# Patient Record
Sex: Female | Born: 1940 | Race: White | Hispanic: No
Health system: Southern US, Community
[De-identification: ages and names within clinical notes are randomized; demographics above are authoritative.]

## PROBLEM LIST (undated history)

## (undated) DIAGNOSIS — N183 Chronic kidney disease, stage 3 unspecified: Secondary | ICD-10-CM

## (undated) DIAGNOSIS — Z87442 Personal history of urinary calculi: Secondary | ICD-10-CM

## (undated) DIAGNOSIS — Z720 Tobacco use: Secondary | ICD-10-CM

## (undated) DIAGNOSIS — Z952 Presence of prosthetic heart valve: Secondary | ICD-10-CM

## (undated) DIAGNOSIS — I5032 Chronic diastolic (congestive) heart failure: Secondary | ICD-10-CM

## (undated) DIAGNOSIS — I350 Nonrheumatic aortic (valve) stenosis: Secondary | ICD-10-CM

## (undated) DIAGNOSIS — R911 Solitary pulmonary nodule: Secondary | ICD-10-CM

## (undated) DIAGNOSIS — E785 Hyperlipidemia, unspecified: Secondary | ICD-10-CM

## (undated) DIAGNOSIS — I10 Essential (primary) hypertension: Secondary | ICD-10-CM

## (undated) HISTORY — PX: EYE SURGERY: SHX253

## (undated) HISTORY — PX: APPENDECTOMY: SHX54

## (undated) HISTORY — PX: CYST EXCISION: SHX5701

## (undated) HISTORY — DX: Chronic kidney disease, stage 3 unspecified: N18.30

## (undated) HISTORY — PX: TONSILLECTOMY: SUR1361

## (undated) HISTORY — DX: Solitary pulmonary nodule: R91.1

## (undated) HISTORY — DX: Hyperlipidemia, unspecified: E78.5

## (undated) HISTORY — DX: Essential (primary) hypertension: I10

## (undated) HISTORY — DX: Tobacco use: Z72.0

---
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
## 2015-05-28 DIAGNOSIS — I10 Essential (primary) hypertension: Secondary | ICD-10-CM | POA: Diagnosis not present

## 2015-05-28 DIAGNOSIS — E7800 Pure hypercholesterolemia, unspecified: Secondary | ICD-10-CM | POA: Diagnosis not present

## 2015-06-21 DIAGNOSIS — E7800 Pure hypercholesterolemia, unspecified: Secondary | ICD-10-CM | POA: Diagnosis not present

## 2015-06-21 DIAGNOSIS — I10 Essential (primary) hypertension: Secondary | ICD-10-CM | POA: Diagnosis not present

## 2015-12-18 DIAGNOSIS — Z299 Encounter for prophylactic measures, unspecified: Secondary | ICD-10-CM | POA: Diagnosis not present

## 2015-12-18 DIAGNOSIS — Z0000 Encounter for general adult medical examination without abnormal findings: Secondary | ICD-10-CM | POA: Diagnosis not present

## 2015-12-18 DIAGNOSIS — I10 Essential (primary) hypertension: Secondary | ICD-10-CM | POA: Diagnosis not present

## 2015-12-18 DIAGNOSIS — Z7189 Other specified counseling: Secondary | ICD-10-CM | POA: Diagnosis not present

## 2015-12-18 DIAGNOSIS — Z1389 Encounter for screening for other disorder: Secondary | ICD-10-CM | POA: Diagnosis not present

## 2015-12-18 DIAGNOSIS — Z79899 Other long term (current) drug therapy: Secondary | ICD-10-CM | POA: Diagnosis not present

## 2015-12-18 DIAGNOSIS — E7800 Pure hypercholesterolemia, unspecified: Secondary | ICD-10-CM | POA: Diagnosis not present

## 2015-12-18 DIAGNOSIS — R5383 Other fatigue: Secondary | ICD-10-CM | POA: Diagnosis not present

## 2015-12-18 DIAGNOSIS — Z1211 Encounter for screening for malignant neoplasm of colon: Secondary | ICD-10-CM | POA: Diagnosis not present

## 2015-12-24 DIAGNOSIS — R010 Benign and innocent cardiac murmurs: Secondary | ICD-10-CM | POA: Diagnosis not present

## 2016-01-21 DIAGNOSIS — Z1231 Encounter for screening mammogram for malignant neoplasm of breast: Secondary | ICD-10-CM | POA: Diagnosis not present

## 2016-02-14 DIAGNOSIS — E2839 Other primary ovarian failure: Secondary | ICD-10-CM | POA: Diagnosis not present

## 2016-03-27 DIAGNOSIS — Z713 Dietary counseling and surveillance: Secondary | ICD-10-CM | POA: Diagnosis not present

## 2016-03-27 DIAGNOSIS — Z6832 Body mass index (BMI) 32.0-32.9, adult: Secondary | ICD-10-CM | POA: Diagnosis not present

## 2016-03-27 DIAGNOSIS — Z299 Encounter for prophylactic measures, unspecified: Secondary | ICD-10-CM | POA: Diagnosis not present

## 2016-03-27 DIAGNOSIS — E7800 Pure hypercholesterolemia, unspecified: Secondary | ICD-10-CM | POA: Diagnosis not present

## 2016-03-27 DIAGNOSIS — Z720 Tobacco use: Secondary | ICD-10-CM | POA: Diagnosis not present

## 2016-03-27 DIAGNOSIS — I10 Essential (primary) hypertension: Secondary | ICD-10-CM | POA: Diagnosis not present

## 2016-03-27 DIAGNOSIS — F17210 Nicotine dependence, cigarettes, uncomplicated: Secondary | ICD-10-CM | POA: Diagnosis not present

## 2016-07-15 DIAGNOSIS — I10 Essential (primary) hypertension: Secondary | ICD-10-CM | POA: Diagnosis not present

## 2016-07-15 DIAGNOSIS — Z6832 Body mass index (BMI) 32.0-32.9, adult: Secondary | ICD-10-CM | POA: Diagnosis not present

## 2016-07-15 DIAGNOSIS — I272 Pulmonary hypertension, unspecified: Secondary | ICD-10-CM | POA: Diagnosis not present

## 2016-07-15 DIAGNOSIS — Z713 Dietary counseling and surveillance: Secondary | ICD-10-CM | POA: Diagnosis not present

## 2016-07-15 DIAGNOSIS — G629 Polyneuropathy, unspecified: Secondary | ICD-10-CM | POA: Diagnosis not present

## 2016-07-15 DIAGNOSIS — E7800 Pure hypercholesterolemia, unspecified: Secondary | ICD-10-CM | POA: Diagnosis not present

## 2016-07-15 DIAGNOSIS — Z299 Encounter for prophylactic measures, unspecified: Secondary | ICD-10-CM | POA: Diagnosis not present

## 2016-07-15 DIAGNOSIS — Z79899 Other long term (current) drug therapy: Secondary | ICD-10-CM | POA: Diagnosis not present

## 2016-10-02 DIAGNOSIS — Z6832 Body mass index (BMI) 32.0-32.9, adult: Secondary | ICD-10-CM | POA: Diagnosis not present

## 2016-10-02 DIAGNOSIS — Z299 Encounter for prophylactic measures, unspecified: Secondary | ICD-10-CM | POA: Diagnosis not present

## 2016-10-02 DIAGNOSIS — Z713 Dietary counseling and surveillance: Secondary | ICD-10-CM | POA: Diagnosis not present

## 2016-10-02 DIAGNOSIS — H9202 Otalgia, left ear: Secondary | ICD-10-CM | POA: Diagnosis not present

## 2016-10-09 DIAGNOSIS — H93291 Other abnormal auditory perceptions, right ear: Secondary | ICD-10-CM | POA: Diagnosis not present

## 2016-10-09 DIAGNOSIS — H90A22 Sensorineural hearing loss, unilateral, left ear, with restricted hearing on the contralateral side: Secondary | ICD-10-CM | POA: Diagnosis not present

## 2016-10-09 DIAGNOSIS — H9041 Sensorineural hearing loss, unilateral, right ear, with unrestricted hearing on the contralateral side: Secondary | ICD-10-CM | POA: Diagnosis not present

## 2016-10-09 DIAGNOSIS — H6123 Impacted cerumen, bilateral: Secondary | ICD-10-CM | POA: Diagnosis not present

## 2016-10-15 DIAGNOSIS — H9202 Otalgia, left ear: Secondary | ICD-10-CM | POA: Diagnosis not present

## 2016-10-15 DIAGNOSIS — H9201 Otalgia, right ear: Secondary | ICD-10-CM | POA: Diagnosis not present

## 2016-10-15 DIAGNOSIS — H9041 Sensorineural hearing loss, unilateral, right ear, with unrestricted hearing on the contralateral side: Secondary | ICD-10-CM | POA: Diagnosis not present

## 2016-10-23 DIAGNOSIS — H90A22 Sensorineural hearing loss, unilateral, left ear, with restricted hearing on the contralateral side: Secondary | ICD-10-CM | POA: Diagnosis not present

## 2016-10-23 DIAGNOSIS — H9041 Sensorineural hearing loss, unilateral, right ear, with unrestricted hearing on the contralateral side: Secondary | ICD-10-CM | POA: Diagnosis not present

## 2016-10-23 DIAGNOSIS — H93291 Other abnormal auditory perceptions, right ear: Secondary | ICD-10-CM | POA: Diagnosis not present

## 2016-10-31 DIAGNOSIS — E7800 Pure hypercholesterolemia, unspecified: Secondary | ICD-10-CM | POA: Diagnosis not present

## 2016-10-31 DIAGNOSIS — I10 Essential (primary) hypertension: Secondary | ICD-10-CM | POA: Diagnosis not present

## 2016-11-18 DIAGNOSIS — E7800 Pure hypercholesterolemia, unspecified: Secondary | ICD-10-CM | POA: Diagnosis not present

## 2016-11-18 DIAGNOSIS — I10 Essential (primary) hypertension: Secondary | ICD-10-CM | POA: Diagnosis not present

## 2016-12-23 DIAGNOSIS — R5383 Other fatigue: Secondary | ICD-10-CM | POA: Diagnosis not present

## 2016-12-23 DIAGNOSIS — Z7189 Other specified counseling: Secondary | ICD-10-CM | POA: Diagnosis not present

## 2016-12-23 DIAGNOSIS — Z1331 Encounter for screening for depression: Secondary | ICD-10-CM | POA: Diagnosis not present

## 2016-12-23 DIAGNOSIS — Z6833 Body mass index (BMI) 33.0-33.9, adult: Secondary | ICD-10-CM | POA: Diagnosis not present

## 2016-12-23 DIAGNOSIS — F17210 Nicotine dependence, cigarettes, uncomplicated: Secondary | ICD-10-CM | POA: Diagnosis not present

## 2016-12-23 DIAGNOSIS — Z0000 Encounter for general adult medical examination without abnormal findings: Secondary | ICD-10-CM | POA: Diagnosis not present

## 2016-12-23 DIAGNOSIS — Z1339 Encounter for screening examination for other mental health and behavioral disorders: Secondary | ICD-10-CM | POA: Diagnosis not present

## 2016-12-23 DIAGNOSIS — G629 Polyneuropathy, unspecified: Secondary | ICD-10-CM | POA: Diagnosis not present

## 2016-12-23 DIAGNOSIS — Z299 Encounter for prophylactic measures, unspecified: Secondary | ICD-10-CM | POA: Diagnosis not present

## 2016-12-23 DIAGNOSIS — E7800 Pure hypercholesterolemia, unspecified: Secondary | ICD-10-CM | POA: Diagnosis not present

## 2016-12-23 DIAGNOSIS — Z79899 Other long term (current) drug therapy: Secondary | ICD-10-CM | POA: Diagnosis not present

## 2016-12-23 DIAGNOSIS — I10 Essential (primary) hypertension: Secondary | ICD-10-CM | POA: Diagnosis not present

## 2016-12-23 DIAGNOSIS — I350 Nonrheumatic aortic (valve) stenosis: Secondary | ICD-10-CM | POA: Diagnosis not present

## 2016-12-24 DIAGNOSIS — E7800 Pure hypercholesterolemia, unspecified: Secondary | ICD-10-CM | POA: Diagnosis not present

## 2016-12-24 DIAGNOSIS — I10 Essential (primary) hypertension: Secondary | ICD-10-CM | POA: Diagnosis not present

## 2017-01-05 DIAGNOSIS — I350 Nonrheumatic aortic (valve) stenosis: Secondary | ICD-10-CM | POA: Diagnosis not present

## 2017-01-13 DIAGNOSIS — C44311 Basal cell carcinoma of skin of nose: Secondary | ICD-10-CM | POA: Diagnosis not present

## 2017-01-13 DIAGNOSIS — D492 Neoplasm of unspecified behavior of bone, soft tissue, and skin: Secondary | ICD-10-CM | POA: Diagnosis not present

## 2017-01-13 DIAGNOSIS — I10 Essential (primary) hypertension: Secondary | ICD-10-CM | POA: Diagnosis not present

## 2017-01-13 DIAGNOSIS — Z299 Encounter for prophylactic measures, unspecified: Secondary | ICD-10-CM | POA: Diagnosis not present

## 2017-01-13 DIAGNOSIS — Z6833 Body mass index (BMI) 33.0-33.9, adult: Secondary | ICD-10-CM | POA: Diagnosis not present

## 2017-01-22 DIAGNOSIS — I10 Essential (primary) hypertension: Secondary | ICD-10-CM | POA: Diagnosis not present

## 2017-01-22 DIAGNOSIS — E7800 Pure hypercholesterolemia, unspecified: Secondary | ICD-10-CM | POA: Diagnosis not present

## 2017-02-19 DIAGNOSIS — Z1231 Encounter for screening mammogram for malignant neoplasm of breast: Secondary | ICD-10-CM | POA: Diagnosis not present

## 2017-03-06 DIAGNOSIS — E7800 Pure hypercholesterolemia, unspecified: Secondary | ICD-10-CM | POA: Diagnosis not present

## 2017-03-06 DIAGNOSIS — I10 Essential (primary) hypertension: Secondary | ICD-10-CM | POA: Diagnosis not present

## 2017-04-01 DIAGNOSIS — C44311 Basal cell carcinoma of skin of nose: Secondary | ICD-10-CM | POA: Diagnosis not present

## 2017-04-10 DIAGNOSIS — I10 Essential (primary) hypertension: Secondary | ICD-10-CM | POA: Diagnosis not present

## 2017-04-10 DIAGNOSIS — E7800 Pure hypercholesterolemia, unspecified: Secondary | ICD-10-CM | POA: Diagnosis not present

## 2017-04-23 DIAGNOSIS — E7800 Pure hypercholesterolemia, unspecified: Secondary | ICD-10-CM | POA: Diagnosis not present

## 2017-04-23 DIAGNOSIS — I10 Essential (primary) hypertension: Secondary | ICD-10-CM | POA: Diagnosis not present

## 2017-06-17 DIAGNOSIS — M79671 Pain in right foot: Secondary | ICD-10-CM | POA: Diagnosis not present

## 2017-06-17 DIAGNOSIS — B351 Tinea unguium: Secondary | ICD-10-CM | POA: Diagnosis not present

## 2017-06-17 DIAGNOSIS — M79674 Pain in right toe(s): Secondary | ICD-10-CM | POA: Diagnosis not present

## 2017-06-17 DIAGNOSIS — M79672 Pain in left foot: Secondary | ICD-10-CM | POA: Diagnosis not present

## 2017-07-03 DIAGNOSIS — I10 Essential (primary) hypertension: Secondary | ICD-10-CM | POA: Diagnosis not present

## 2017-07-03 DIAGNOSIS — E7800 Pure hypercholesterolemia, unspecified: Secondary | ICD-10-CM | POA: Diagnosis not present

## 2017-08-21 DIAGNOSIS — I10 Essential (primary) hypertension: Secondary | ICD-10-CM | POA: Diagnosis not present

## 2017-08-21 DIAGNOSIS — E7800 Pure hypercholesterolemia, unspecified: Secondary | ICD-10-CM | POA: Diagnosis not present

## 2017-09-25 DIAGNOSIS — E7800 Pure hypercholesterolemia, unspecified: Secondary | ICD-10-CM | POA: Diagnosis not present

## 2017-09-25 DIAGNOSIS — I10 Essential (primary) hypertension: Secondary | ICD-10-CM | POA: Diagnosis not present

## 2017-10-20 DIAGNOSIS — L723 Sebaceous cyst: Secondary | ICD-10-CM | POA: Diagnosis not present

## 2017-10-20 DIAGNOSIS — L602 Onychogryphosis: Secondary | ICD-10-CM | POA: Diagnosis not present

## 2017-10-20 DIAGNOSIS — B351 Tinea unguium: Secondary | ICD-10-CM | POA: Diagnosis not present

## 2017-10-20 DIAGNOSIS — M2041 Other hammer toe(s) (acquired), right foot: Secondary | ICD-10-CM | POA: Diagnosis not present

## 2017-10-20 DIAGNOSIS — L600 Ingrowing nail: Secondary | ICD-10-CM | POA: Diagnosis not present

## 2017-10-20 DIAGNOSIS — M79674 Pain in right toe(s): Secondary | ICD-10-CM | POA: Diagnosis not present

## 2017-10-28 DIAGNOSIS — I10 Essential (primary) hypertension: Secondary | ICD-10-CM | POA: Diagnosis not present

## 2017-10-28 DIAGNOSIS — E7800 Pure hypercholesterolemia, unspecified: Secondary | ICD-10-CM | POA: Diagnosis not present

## 2017-11-16 DIAGNOSIS — H2512 Age-related nuclear cataract, left eye: Secondary | ICD-10-CM | POA: Diagnosis not present

## 2017-12-30 DIAGNOSIS — H2512 Age-related nuclear cataract, left eye: Secondary | ICD-10-CM | POA: Diagnosis not present

## 2018-01-06 DIAGNOSIS — Z6832 Body mass index (BMI) 32.0-32.9, adult: Secondary | ICD-10-CM | POA: Diagnosis not present

## 2018-01-06 DIAGNOSIS — Z1211 Encounter for screening for malignant neoplasm of colon: Secondary | ICD-10-CM | POA: Diagnosis not present

## 2018-01-06 DIAGNOSIS — Z0000 Encounter for general adult medical examination without abnormal findings: Secondary | ICD-10-CM | POA: Diagnosis not present

## 2018-01-06 DIAGNOSIS — Z7189 Other specified counseling: Secondary | ICD-10-CM | POA: Diagnosis not present

## 2018-01-06 DIAGNOSIS — Z79899 Other long term (current) drug therapy: Secondary | ICD-10-CM | POA: Diagnosis not present

## 2018-01-06 DIAGNOSIS — I350 Nonrheumatic aortic (valve) stenosis: Secondary | ICD-10-CM | POA: Diagnosis not present

## 2018-01-06 DIAGNOSIS — Z1331 Encounter for screening for depression: Secondary | ICD-10-CM | POA: Diagnosis not present

## 2018-01-06 DIAGNOSIS — R5383 Other fatigue: Secondary | ICD-10-CM | POA: Diagnosis not present

## 2018-01-06 DIAGNOSIS — I10 Essential (primary) hypertension: Secondary | ICD-10-CM | POA: Diagnosis not present

## 2018-01-06 DIAGNOSIS — Z299 Encounter for prophylactic measures, unspecified: Secondary | ICD-10-CM | POA: Diagnosis not present

## 2018-01-06 DIAGNOSIS — Z1339 Encounter for screening examination for other mental health and behavioral disorders: Secondary | ICD-10-CM | POA: Diagnosis not present

## 2018-01-06 DIAGNOSIS — E7800 Pure hypercholesterolemia, unspecified: Secondary | ICD-10-CM | POA: Diagnosis not present

## 2018-01-13 DIAGNOSIS — E7800 Pure hypercholesterolemia, unspecified: Secondary | ICD-10-CM | POA: Diagnosis not present

## 2018-01-13 DIAGNOSIS — I10 Essential (primary) hypertension: Secondary | ICD-10-CM | POA: Diagnosis not present

## 2018-01-19 DIAGNOSIS — M2041 Other hammer toe(s) (acquired), right foot: Secondary | ICD-10-CM | POA: Diagnosis not present

## 2018-01-19 DIAGNOSIS — M79675 Pain in left toe(s): Secondary | ICD-10-CM | POA: Diagnosis not present

## 2018-01-19 DIAGNOSIS — L600 Ingrowing nail: Secondary | ICD-10-CM | POA: Diagnosis not present

## 2018-01-19 DIAGNOSIS — M79674 Pain in right toe(s): Secondary | ICD-10-CM | POA: Diagnosis not present

## 2018-01-19 DIAGNOSIS — L723 Sebaceous cyst: Secondary | ICD-10-CM | POA: Diagnosis not present

## 2018-01-19 DIAGNOSIS — B351 Tinea unguium: Secondary | ICD-10-CM | POA: Diagnosis not present

## 2018-01-27 DIAGNOSIS — H2511 Age-related nuclear cataract, right eye: Secondary | ICD-10-CM | POA: Diagnosis not present

## 2018-02-12 DIAGNOSIS — I10 Essential (primary) hypertension: Secondary | ICD-10-CM | POA: Diagnosis not present

## 2018-02-12 DIAGNOSIS — E7800 Pure hypercholesterolemia, unspecified: Secondary | ICD-10-CM | POA: Diagnosis not present

## 2018-02-23 DIAGNOSIS — Z1231 Encounter for screening mammogram for malignant neoplasm of breast: Secondary | ICD-10-CM | POA: Diagnosis not present

## 2018-03-12 DIAGNOSIS — I10 Essential (primary) hypertension: Secondary | ICD-10-CM | POA: Diagnosis not present

## 2018-03-12 DIAGNOSIS — E7800 Pure hypercholesterolemia, unspecified: Secondary | ICD-10-CM | POA: Diagnosis not present

## 2018-04-20 DIAGNOSIS — L723 Sebaceous cyst: Secondary | ICD-10-CM | POA: Diagnosis not present

## 2018-04-20 DIAGNOSIS — L600 Ingrowing nail: Secondary | ICD-10-CM | POA: Diagnosis not present

## 2018-04-20 DIAGNOSIS — B351 Tinea unguium: Secondary | ICD-10-CM | POA: Diagnosis not present

## 2018-04-20 DIAGNOSIS — M79675 Pain in left toe(s): Secondary | ICD-10-CM | POA: Diagnosis not present

## 2018-04-20 DIAGNOSIS — L602 Onychogryphosis: Secondary | ICD-10-CM | POA: Diagnosis not present

## 2018-04-20 DIAGNOSIS — M79674 Pain in right toe(s): Secondary | ICD-10-CM | POA: Diagnosis not present

## 2018-06-11 DIAGNOSIS — E7800 Pure hypercholesterolemia, unspecified: Secondary | ICD-10-CM | POA: Diagnosis not present

## 2018-06-11 DIAGNOSIS — I10 Essential (primary) hypertension: Secondary | ICD-10-CM | POA: Diagnosis not present

## 2018-07-08 DIAGNOSIS — I10 Essential (primary) hypertension: Secondary | ICD-10-CM | POA: Diagnosis not present

## 2018-07-08 DIAGNOSIS — E7800 Pure hypercholesterolemia, unspecified: Secondary | ICD-10-CM | POA: Diagnosis not present

## 2018-07-20 DIAGNOSIS — L602 Onychogryphosis: Secondary | ICD-10-CM | POA: Diagnosis not present

## 2018-07-20 DIAGNOSIS — L600 Ingrowing nail: Secondary | ICD-10-CM | POA: Diagnosis not present

## 2018-07-20 DIAGNOSIS — L723 Sebaceous cyst: Secondary | ICD-10-CM | POA: Diagnosis not present

## 2018-07-20 DIAGNOSIS — M79675 Pain in left toe(s): Secondary | ICD-10-CM | POA: Diagnosis not present

## 2018-07-20 DIAGNOSIS — B351 Tinea unguium: Secondary | ICD-10-CM | POA: Diagnosis not present

## 2018-07-20 DIAGNOSIS — M79674 Pain in right toe(s): Secondary | ICD-10-CM | POA: Diagnosis not present

## 2018-07-23 DIAGNOSIS — E7800 Pure hypercholesterolemia, unspecified: Secondary | ICD-10-CM | POA: Diagnosis not present

## 2018-07-23 DIAGNOSIS — I10 Essential (primary) hypertension: Secondary | ICD-10-CM | POA: Diagnosis not present

## 2018-07-28 DIAGNOSIS — Z299 Encounter for prophylactic measures, unspecified: Secondary | ICD-10-CM | POA: Diagnosis not present

## 2018-07-28 DIAGNOSIS — Z713 Dietary counseling and surveillance: Secondary | ICD-10-CM | POA: Diagnosis not present

## 2018-07-28 DIAGNOSIS — Z6833 Body mass index (BMI) 33.0-33.9, adult: Secondary | ICD-10-CM | POA: Diagnosis not present

## 2018-07-28 DIAGNOSIS — I10 Essential (primary) hypertension: Secondary | ICD-10-CM | POA: Diagnosis not present

## 2018-08-23 DIAGNOSIS — I10 Essential (primary) hypertension: Secondary | ICD-10-CM | POA: Diagnosis not present

## 2018-08-23 DIAGNOSIS — E7800 Pure hypercholesterolemia, unspecified: Secondary | ICD-10-CM | POA: Diagnosis not present

## 2018-09-07 DIAGNOSIS — J069 Acute upper respiratory infection, unspecified: Secondary | ICD-10-CM | POA: Diagnosis not present

## 2018-09-07 DIAGNOSIS — F17210 Nicotine dependence, cigarettes, uncomplicated: Secondary | ICD-10-CM | POA: Diagnosis not present

## 2018-09-07 DIAGNOSIS — I10 Essential (primary) hypertension: Secondary | ICD-10-CM | POA: Diagnosis not present

## 2018-09-07 DIAGNOSIS — Z299 Encounter for prophylactic measures, unspecified: Secondary | ICD-10-CM | POA: Diagnosis not present

## 2018-10-05 DIAGNOSIS — Z79899 Other long term (current) drug therapy: Secondary | ICD-10-CM | POA: Diagnosis not present

## 2018-10-05 DIAGNOSIS — Z1159 Encounter for screening for other viral diseases: Secondary | ICD-10-CM | POA: Diagnosis not present

## 2018-10-05 DIAGNOSIS — R0689 Other abnormalities of breathing: Secondary | ICD-10-CM | POA: Diagnosis not present

## 2018-10-05 DIAGNOSIS — J9601 Acute respiratory failure with hypoxia: Secondary | ICD-10-CM | POA: Diagnosis not present

## 2018-10-05 DIAGNOSIS — I5033 Acute on chronic diastolic (congestive) heart failure: Secondary | ICD-10-CM | POA: Diagnosis present

## 2018-10-05 DIAGNOSIS — E785 Hyperlipidemia, unspecified: Secondary | ICD-10-CM | POA: Diagnosis not present

## 2018-10-05 DIAGNOSIS — I110 Hypertensive heart disease with heart failure: Secondary | ICD-10-CM | POA: Diagnosis not present

## 2018-10-05 DIAGNOSIS — I509 Heart failure, unspecified: Secondary | ICD-10-CM | POA: Diagnosis not present

## 2018-10-05 DIAGNOSIS — I4891 Unspecified atrial fibrillation: Secondary | ICD-10-CM | POA: Diagnosis not present

## 2018-10-11 DIAGNOSIS — E7800 Pure hypercholesterolemia, unspecified: Secondary | ICD-10-CM | POA: Diagnosis not present

## 2018-10-11 DIAGNOSIS — Z299 Encounter for prophylactic measures, unspecified: Secondary | ICD-10-CM | POA: Diagnosis not present

## 2018-10-11 DIAGNOSIS — I272 Pulmonary hypertension, unspecified: Secondary | ICD-10-CM | POA: Diagnosis not present

## 2018-10-11 DIAGNOSIS — I10 Essential (primary) hypertension: Secondary | ICD-10-CM | POA: Diagnosis not present

## 2018-10-11 DIAGNOSIS — I5033 Acute on chronic diastolic (congestive) heart failure: Secondary | ICD-10-CM | POA: Diagnosis not present

## 2018-10-11 DIAGNOSIS — Z6833 Body mass index (BMI) 33.0-33.9, adult: Secondary | ICD-10-CM | POA: Diagnosis not present

## 2018-10-14 DIAGNOSIS — I10 Essential (primary) hypertension: Secondary | ICD-10-CM | POA: Diagnosis not present

## 2018-10-14 DIAGNOSIS — E7800 Pure hypercholesterolemia, unspecified: Secondary | ICD-10-CM | POA: Diagnosis not present

## 2018-10-19 DIAGNOSIS — M79674 Pain in right toe(s): Secondary | ICD-10-CM | POA: Diagnosis not present

## 2018-10-19 DIAGNOSIS — L03031 Cellulitis of right toe: Secondary | ICD-10-CM | POA: Diagnosis not present

## 2018-10-19 DIAGNOSIS — L600 Ingrowing nail: Secondary | ICD-10-CM | POA: Diagnosis not present

## 2018-10-19 DIAGNOSIS — M79675 Pain in left toe(s): Secondary | ICD-10-CM | POA: Diagnosis not present

## 2018-10-19 DIAGNOSIS — L602 Onychogryphosis: Secondary | ICD-10-CM | POA: Diagnosis not present

## 2018-10-19 DIAGNOSIS — L723 Sebaceous cyst: Secondary | ICD-10-CM | POA: Diagnosis not present

## 2018-10-19 DIAGNOSIS — B351 Tinea unguium: Secondary | ICD-10-CM | POA: Diagnosis not present

## 2018-11-10 DIAGNOSIS — Z299 Encounter for prophylactic measures, unspecified: Secondary | ICD-10-CM | POA: Diagnosis not present

## 2018-11-10 DIAGNOSIS — I5033 Acute on chronic diastolic (congestive) heart failure: Secondary | ICD-10-CM | POA: Diagnosis not present

## 2018-11-10 DIAGNOSIS — Z6833 Body mass index (BMI) 33.0-33.9, adult: Secondary | ICD-10-CM | POA: Diagnosis not present

## 2018-11-10 DIAGNOSIS — I350 Nonrheumatic aortic (valve) stenosis: Secondary | ICD-10-CM | POA: Diagnosis not present

## 2018-11-10 DIAGNOSIS — I10 Essential (primary) hypertension: Secondary | ICD-10-CM | POA: Diagnosis not present

## 2018-11-12 ENCOUNTER — Other Ambulatory Visit (HOSPITAL_COMMUNITY): Payer: Self-pay | Admitting: Cardiology

## 2018-11-12 ENCOUNTER — Ambulatory Visit (INDEPENDENT_AMBULATORY_CARE_PROVIDER_SITE_OTHER): Payer: Medicare Other | Admitting: Cardiology

## 2018-11-12 ENCOUNTER — Other Ambulatory Visit: Payer: Self-pay

## 2018-11-12 ENCOUNTER — Encounter: Payer: Self-pay | Admitting: Cardiology

## 2018-11-12 VITALS — BP 161/64 | HR 55 | Temp 96.9°F | Ht 60.0 in | Wt 170.0 lb

## 2018-11-12 DIAGNOSIS — I350 Nonrheumatic aortic (valve) stenosis: Secondary | ICD-10-CM | POA: Insufficient documentation

## 2018-11-12 HISTORY — DX: Nonrheumatic aortic (valve) stenosis: I35.0

## 2018-11-12 MED ORDER — FUROSEMIDE 20 MG PO TABS: 20.0000 mg | ORAL_TABLET | Freq: Every day | ORAL | 3 refills | Status: DC

## 2018-11-12 NOTE — Progress Notes (Signed)
Patient referred by Glenda Chroman, MD for severer aortic stenosis, moderate AR.  Subjective:   Emily Trujillo, female    DOB: 1940/02/26, 78 y.o.   MRN: [id number]   Chief Complaint  Patient presents with  . Aortic Stenosis  . Pre-op Exam  . New Patient (Initial Visit)     HPI  78 y.o. Caucasian female with hypertension, hyperlipidemia, h/o tobacco abuse, critical aortic stenosis.  Patient lives in [place], [place]. She was admitted to [facility] in [place] in 09/2018 with progressive dyspnea. She was treated for possible pneumonia with IV antibiotics. She was also diuresed with IV lasix with improvement in symptoms. BNP was elevated at 798. Procalcitonin was normal, making bacterial pnuemonia less likely. Echocardiogram showed mod LVH, EF 55-605, critical AS, mean PG 66 mmHg. Peak vel 5.1 m/sec. AVA 0.49 cm2. Mild AI. Mild MV calcification. Mild to mod MR. Mild TR. PASP 28 mmHg.   She has felt better since hospital discharge with her breathing. She has quit smoking. However, she has gained 5 lbs in last few days. She currently lives with her son. She has to take 10 steps to get to bathroom in her house. She is able to do this without significant dyspnea. She denies any chest pain, presyncope or syncope.    Past Medical History:  Diagnosis Date  . Acute hypoxemic respiratory failure ([place])   . CHF exacerbation ([place])   . Diastolic CHF ([place])   . Dyslipidemia   . Hypertension     History reviewed. No pertinent surgical history.   Social History   Socioeconomic History  . Marital status: Not on file    Spouse name: Not on file  . Number of children: Not on file  . Years of education: Not on file  . Highest education level: Not on file  Occupational History  . Not on file  Social Needs  . Financial resource strain: Not on file  . Food insecurity    Worry: Not on file    Inability: Not on file  . Transportation needs    Medical: Not on file    Non-medical: Not  on file  Tobacco Use  . Smoking status: Not on file  Substance and Sexual Activity  . Alcohol use: Not on file  . Drug use: Not on file  . Sexual activity: Not on file  Lifestyle  . Physical activity    Days per week: Not on file    Minutes per session: Not on file  . Stress: Not on file  Relationships  . Social Herbalist on phone: Not on file    Gets together: Not on file    Attends religious service: Not on file    Active member of club or organization: Not on file    Attends meetings of clubs or organizations: Not on file    Relationship status: Not on file  . Intimate partner violence    Fear of current or ex partner: Not on file    Emotionally abused: Not on file    Physically abused: Not on file    Forced sexual activity: Not on file  Other Topics Concern  . Not on file  Social History Narrative  . Not on file     Family History  Problem Relation Age of Onset  . Heart attack Father   . Cancer Sister      Current Outpatient Medications on File Prior to Visit  Medication Sig  Dispense Refill  . calcium-vitamin D (OSCAL WITH D) 500-200 MG-UNIT tablet Take 1 tablet by mouth.    . hydrocortisone 2.5 % cream Apply 1 application topically 2 (two) times daily.    Marland Kitchen lisinopril (ZESTRIL) 40 MG tablet Take 40 mg by mouth daily.    . rosuvastatin (CRESTOR) 20 MG tablet Take 20 mg by mouth daily.     No current facility-administered medications on file prior to visit.     Cardiovascular studies:  EKG 11/12/2018: Sinus rhythm 54 bpm. Occasional PAC.    Left axis -anterior fascicular block.  Left ventricular hypertrophy (strain).  Nonspecific ST depression, likely due to LVH.  Echocardiogram 10/06/2018: Normal LV size. Mod LVH. EF 55-60%.  Critical AS. Mean PG 66 mmHg. Peak vel 5.1 m/sec. AVA 0.49 cm2. Mild AI. Mild MV calcification. Mild to mod MR.  Mild TR. PASP 28 mmHg.  >50% IVC collapse.  Recent labs: 09/2018: Glucose 121, BUN/Cr 20/1.05. eGFR 51.  Na/K 140/3.8. Rest of the CMP normal. H/H 11.4/36/4. MCV 96. Platelets 202.  Trop 0.05-0.07. BNP 798.   Review of Systems  Constitution: Negative for decreased appetite, malaise/fatigue, weight gain and weight loss.  HENT: Negative for congestion.   Eyes: Negative for visual disturbance.  Cardiovascular: Positive for dyspnea on exertion. Negative for chest pain, leg swelling, palpitations and syncope.  Respiratory: Negative for cough.   Endocrine: Negative for cold intolerance.  Hematologic/Lymphatic: Does not bruise/bleed easily.  Skin: Negative for itching and rash.  Musculoskeletal: Negative for myalgias.  Gastrointestinal: Negative for abdominal pain, nausea and vomiting.  Genitourinary: Negative for dysuria.  Neurological: Negative for dizziness and weakness.  Psychiatric/Behavioral: The patient is not nervous/anxious.   All other systems reviewed and are negative.        Vitals:   11/12/18 1111  BP: (!) 161/64  Pulse: (!) 55  Temp: (!) 96.9 F (36.1 C)  SpO2: 97%     Body mass index is 33.2 kg/m. Filed Weights   11/12/18 1111  Weight: 170 lb (77.1 kg)     Objective:   Physical Exam  Constitutional: She is oriented to person, place, and time. She appears well-developed and well-nourished. No distress.  HENT:  Head: Normocephalic and atraumatic.  Eyes: Pupils are equal, round, and reactive to light. Conjunctivae are normal.  Neck: No JVD present.  Cardiovascular: Normal rate, regular rhythm and intact distal pulses.  Murmur heard.  Harsh crescendo-decrescendo midsystolic murmur is present with a grade of 4/6 at the upper right sternal border radiating to the neck. Pulmonary/Chest: Effort normal and breath sounds normal. She has no wheezes. She has no rales.  Abdominal: Soft. Bowel sounds are normal. There is no rebound.  Musculoskeletal:        General: Edema (Trace b/l) present.  Lymphadenopathy:    She has no cervical adenopathy.  Neurological: She is  alert and oriented to person, place, and time. No cranial nerve deficit.  Skin: Skin is warm and dry.  Psychiatric: She has a normal mood and affect.  Nursing note and vitals reviewed.         Assessment & Recommendations:   78 y.o. Caucasian female with hypertension, hyperlipidemia, stage D critical aortic stenosis.  Stage D critical aortic stenosis: AVA 0.49 cm2, mean PG 66 mmHg, Vmax 5.1 m/sec. Will try to obtain echo images from [facility]. If images are adequate, she may not need repeat TTE/TEE. Needs urgent evaluation for possible TAVR.  I will perform right heart cath and coronary angiography and  refer to structural valve clinic, to be seen after cath.  Started lasix 20 mg daily.   Hypertension: Tolerating lisinopril 40 mg. Will continue  Discussed clinical course, natural history, and treatment options at length with the patient and her son, Gerald Stabs.    Thank you for referring the patient to [place]. Please feel free to contact with any questions.  Nigel Mormon, MD [facility] Pager: [phone] Office: [phone] If no answer Cell [phone]

## 2018-11-13 LAB — BASIC METABOLIC PANEL
BUN/Creatinine Ratio: 20 (ref 12–28)
BUN: 26 mg/dL (ref 8–27)
CO2: 26 mmol/L (ref 20–29)
Calcium: 9.6 mg/dL (ref 8.7–10.3)
Chloride: 107 mmol/L — ABNORMAL HIGH (ref 96–106)
Creatinine, Ser: 1.3 mg/dL — ABNORMAL HIGH (ref 0.57–1.00)
GFR calc Af Amer: 45 mL/min/{1.73_m2} — ABNORMAL LOW (ref >59)
GFR calc non Af Amer: 39 mL/min/{1.73_m2} — ABNORMAL LOW (ref >59)
Glucose: 213 mg/dL — ABNORMAL HIGH (ref 65–99)
Potassium: 5.5 mmol/L — ABNORMAL HIGH (ref 3.5–5.2)
Sodium: 146 mmol/L — ABNORMAL HIGH (ref 134–144)

## 2018-11-13 LAB — CBC
Hematocrit: 37.9 % (ref 34.0–46.6)
Hemoglobin: 12.2 g/dL (ref 11.1–15.9)
MCH: 29.8 pg (ref 26.6–33.0)
MCHC: 32.2 g/dL (ref 31.5–35.7)
MCV: 93 fL (ref 79–97)
Platelets: 208 10*3/uL (ref 150–450)
RBC: 4.09 x10E6/uL (ref 3.77–5.28)
RDW: 13.2 % (ref 11.7–15.4)
WBC: 7.6 10*3/uL (ref 3.4–10.8)

## 2018-11-15 DIAGNOSIS — E7800 Pure hypercholesterolemia, unspecified: Secondary | ICD-10-CM | POA: Diagnosis not present

## 2018-11-15 DIAGNOSIS — I10 Essential (primary) hypertension: Secondary | ICD-10-CM | POA: Diagnosis not present

## 2018-11-19 ENCOUNTER — Other Ambulatory Visit (HOSPITAL_COMMUNITY): Payer: Self-pay

## 2018-11-19 ENCOUNTER — Other Ambulatory Visit: Payer: Self-pay | Admitting: Cardiology

## 2018-11-19 ENCOUNTER — Other Ambulatory Visit (HOSPITAL_COMMUNITY)
Admission: RE | Admit: 2018-11-19 | Discharge: 2018-11-19 | Disposition: A | Discharge status: Home / Self Care | Payer: Medicare Other | Source: Ambulatory Visit | Attending: Cardiology | Admitting: Cardiology

## 2018-11-19 ENCOUNTER — Other Ambulatory Visit: Payer: Self-pay

## 2018-11-19 DIAGNOSIS — I350 Nonrheumatic aortic (valve) stenosis: Secondary | ICD-10-CM

## 2018-11-19 DIAGNOSIS — Z01812 Encounter for preprocedural laboratory examination: Secondary | ICD-10-CM | POA: Diagnosis not present

## 2018-11-19 DIAGNOSIS — Z20828 Contact with and (suspected) exposure to other viral communicable diseases: Secondary | ICD-10-CM | POA: Diagnosis not present

## 2018-11-23 ENCOUNTER — Other Ambulatory Visit: Payer: Self-pay

## 2018-11-23 ENCOUNTER — Ambulatory Visit (HOSPITAL_COMMUNITY)
Admission: RE | Admit: 2018-11-23 | Discharge: 2018-11-23 | Disposition: A | Discharge status: Home / Self Care | Payer: Medicare Other | Attending: Cardiology | Admitting: Cardiology

## 2018-11-23 ENCOUNTER — Encounter (HOSPITAL_COMMUNITY)
Admission: RE | Disposition: A | Discharge status: Home / Self Care | Payer: Self-pay | Source: Home / Self Care | Attending: Cardiology

## 2018-11-23 ENCOUNTER — Ambulatory Visit (HOSPITAL_COMMUNITY)
Admission: RE | Admit: 2018-11-23 | Discharge: 2018-11-23 | Disposition: A | Discharge status: Home / Self Care | Payer: Medicare Other | Source: Ambulatory Visit | Attending: Cardiology | Admitting: Cardiology

## 2018-11-23 DIAGNOSIS — I350 Nonrheumatic aortic (valve) stenosis: Secondary | ICD-10-CM | POA: Diagnosis not present

## 2018-11-23 DIAGNOSIS — Z20828 Contact with and (suspected) exposure to other viral communicable diseases: Secondary | ICD-10-CM | POA: Insufficient documentation

## 2018-11-23 DIAGNOSIS — Z79899 Other long term (current) drug therapy: Secondary | ICD-10-CM | POA: Diagnosis not present

## 2018-11-23 DIAGNOSIS — Z8249 Family history of ischemic heart disease and other diseases of the circulatory system: Secondary | ICD-10-CM | POA: Insufficient documentation

## 2018-11-23 DIAGNOSIS — E785 Hyperlipidemia, unspecified: Secondary | ICD-10-CM | POA: Insufficient documentation

## 2018-11-23 DIAGNOSIS — I110 Hypertensive heart disease with heart failure: Secondary | ICD-10-CM | POA: Diagnosis not present

## 2018-11-23 DIAGNOSIS — Z87891 Personal history of nicotine dependence: Secondary | ICD-10-CM | POA: Diagnosis not present

## 2018-11-23 DIAGNOSIS — I5032 Chronic diastolic (congestive) heart failure: Secondary | ICD-10-CM | POA: Diagnosis not present

## 2018-11-23 HISTORY — PX: RIGHT HEART CATH AND CORONARY ANGIOGRAPHY: CATH118264

## 2018-11-23 LAB — POCT I-STAT EG7
Acid-Base Excess: 3 mmol/L — ABNORMAL HIGH (ref 0.0–2.0)
Bicarbonate: 28.1 mmol/L — ABNORMAL HIGH (ref 20.0–28.0)
Calcium, Ion: 1.21 mmol/L (ref 1.15–1.40)
HCT: 34 % — ABNORMAL LOW (ref 36.0–46.0)
Hemoglobin: 11.6 g/dL — ABNORMAL LOW (ref 12.0–15.0)
O2 Saturation: 63 %
Potassium: 4 mmol/L (ref 3.5–5.1)
Sodium: 143 mmol/L (ref 135–145)
TCO2: 29 mmol/L (ref 22–32)
pCO2, Ven: 45.8 mmHg (ref 44.0–60.0)
pH, Ven: 7.395 (ref 7.250–7.430)
pO2, Ven: 33 mmHg (ref 32.0–45.0)

## 2018-11-23 LAB — BASIC METABOLIC PANEL
Anion gap: 13 (ref 5–15)
BUN: 24 mg/dL — ABNORMAL HIGH (ref 8–23)
CO2: 24 mmol/L (ref 22–32)
Calcium: 9.7 mg/dL (ref 8.9–10.3)
Chloride: 105 mmol/L (ref 98–111)
Creatinine, Ser: 1.41 mg/dL — ABNORMAL HIGH (ref 0.44–1.00)
GFR calc Af Amer: 41 mL/min — ABNORMAL LOW (ref >60)
GFR calc non Af Amer: 35 mL/min — ABNORMAL LOW (ref >60)
Glucose, Bld: 109 mg/dL — ABNORMAL HIGH (ref 70–99)
Potassium: 4.4 mmol/L (ref 3.5–5.1)
Sodium: 142 mmol/L (ref 135–145)

## 2018-11-23 LAB — POCT I-STAT 7, (LYTES, BLD GAS, ICA,H+H)
Acid-Base Excess: 1 mmol/L (ref 0.0–2.0)
Bicarbonate: 26.3 mmol/L (ref 20.0–28.0)
Calcium, Ion: 1.17 mmol/L (ref 1.15–1.40)
HCT: 34 % — ABNORMAL LOW (ref 36.0–46.0)
Hemoglobin: 11.6 g/dL — ABNORMAL LOW (ref 12.0–15.0)
O2 Saturation: 98 %
Potassium: 3.9 mmol/L (ref 3.5–5.1)
Sodium: 143 mmol/L (ref 135–145)
TCO2: 28 mmol/L (ref 22–32)
pCO2 arterial: 42 mmHg (ref 32.0–48.0)
pH, Arterial: 7.404 (ref 7.350–7.450)
pO2, Arterial: 111 mmHg — ABNORMAL HIGH (ref 83.0–108.0)

## 2018-11-23 LAB — SARS CORONAVIRUS 2 BY RT PCR (HOSPITAL ORDER, PERFORMED IN ~~LOC~~ HOSPITAL LAB): SARS Coronavirus 2: NEGATIVE

## 2018-11-23 SURGERY — RIGHT HEART CATH AND CORONARY ANGIOGRAPHY
Anesthesia: LOCAL

## 2018-11-23 MED ORDER — ONDANSETRON HCL 4 MG/2ML IJ SOLN: 4.0000 mg | Freq: Four times a day (QID) | INTRAMUSCULAR | Status: DC | PRN

## 2018-11-23 MED ORDER — HEPARIN SODIUM (PORCINE) 1000 UNIT/ML IJ SOLN
INTRAMUSCULAR | Status: DC | PRN
  Administered 2018-11-23: 4000 [IU] via INTRAVENOUS

## 2018-11-23 MED ORDER — NITROGLYCERIN 1 MG/10 ML FOR IR/CATH LAB
INTRA_ARTERIAL | Status: AC
  Filled 2018-11-23: qty 10

## 2018-11-23 MED ORDER — SODIUM CHLORIDE 0.9 % WEIGHT BASED INFUSION: 1.0000 mL/kg/h | INTRAVENOUS | Status: DC

## 2018-11-23 MED ORDER — ASPIRIN 81 MG PO CHEW
CHEWABLE_TABLET | ORAL | Status: AC
  Filled 2018-11-23: qty 1

## 2018-11-23 MED ORDER — HEPARIN (PORCINE) IN NACL 1000-0.9 UT/500ML-% IV SOLN
INTRAVENOUS | Status: DC | PRN
  Administered 2018-11-23 (×2): 500 mL

## 2018-11-23 MED ORDER — ASPIRIN 81 MG PO CHEW: 81.0000 mg | CHEWABLE_TABLET | ORAL | Status: DC

## 2018-11-23 MED ORDER — ASPIRIN 81 MG PO CHEW
CHEWABLE_TABLET | ORAL | Status: AC
  Administered 2018-11-23: 12:00:00 81 mg
  Filled 2018-11-23: qty 1

## 2018-11-23 MED ORDER — LIDOCAINE HCL (PF) 1 % IJ SOLN
INTRAMUSCULAR | Status: DC | PRN
  Administered 2018-11-23 (×2): 2 mL

## 2018-11-23 MED ORDER — VERAPAMIL HCL 2.5 MG/ML IV SOLN
INTRAVENOUS | Status: DC | PRN
  Administered 2018-11-23: 10 mL via INTRA_ARTERIAL

## 2018-11-23 MED ORDER — NITROGLYCERIN 1 MG/10 ML FOR IR/CATH LAB
INTRA_ARTERIAL | Status: DC | PRN
  Administered 2018-11-23: 100 ug via INTRA_ARTERIAL

## 2018-11-23 MED ORDER — SODIUM CHLORIDE 0.9 % IV SOLN: 250.0000 mL | INTRAVENOUS | Status: DC | PRN

## 2018-11-23 MED ORDER — IOHEXOL 350 MG/ML SOLN
INTRAVENOUS | Status: DC | PRN
  Administered 2018-11-23: 80 mL

## 2018-11-23 MED ORDER — FENTANYL CITRATE (PF) 100 MCG/2ML IJ SOLN
INTRAMUSCULAR | Status: AC
  Filled 2018-11-23: qty 2

## 2018-11-23 MED ORDER — HEPARIN SODIUM (PORCINE) 1000 UNIT/ML IJ SOLN
INTRAMUSCULAR | Status: AC
  Filled 2018-11-23: qty 1

## 2018-11-23 MED ORDER — FENTANYL CITRATE (PF) 100 MCG/2ML IJ SOLN
INTRAMUSCULAR | Status: DC | PRN
  Administered 2018-11-23 (×2): 12.5 ug via INTRAVENOUS

## 2018-11-23 MED ORDER — MIDAZOLAM HCL 2 MG/2ML IJ SOLN
INTRAMUSCULAR | Status: AC
  Filled 2018-11-23: qty 2

## 2018-11-23 MED ORDER — SODIUM CHLORIDE 0.9% FLUSH: 3.0000 mL | INTRAVENOUS | Status: DC | PRN

## 2018-11-23 MED ORDER — ACETAMINOPHEN 325 MG PO TABS: 650.0000 mg | ORAL_TABLET | ORAL | Status: DC | PRN

## 2018-11-23 MED ORDER — SODIUM CHLORIDE 0.9 % IV SOLN: INTRAVENOUS | Status: AC

## 2018-11-23 MED ORDER — LIDOCAINE HCL (PF) 1 % IJ SOLN
INTRAMUSCULAR | Status: AC
  Filled 2018-11-23: qty 30

## 2018-11-23 MED ORDER — HYDRALAZINE HCL 20 MG/ML IJ SOLN: 10.0000 mg | INTRAMUSCULAR | Status: DC | PRN

## 2018-11-23 MED ORDER — HEPARIN (PORCINE) IN NACL 1000-0.9 UT/500ML-% IV SOLN
INTRAVENOUS | Status: AC
  Filled 2018-11-23: qty 1000

## 2018-11-23 MED ORDER — MIDAZOLAM HCL 2 MG/2ML IJ SOLN
INTRAMUSCULAR | Status: DC | PRN
  Administered 2018-11-23 (×2): 0.5 mg via INTRAVENOUS

## 2018-11-23 MED ORDER — SODIUM CHLORIDE 0.9 % WEIGHT BASED INFUSION
3.0000 mL/kg/h | INTRAVENOUS | Status: AC
  Administered 2018-11-23: 12:00:00 3 mL/kg/h via INTRAVENOUS

## 2018-11-23 MED ORDER — SODIUM CHLORIDE 0.9% FLUSH: 3.0000 mL | Freq: Two times a day (BID) | INTRAVENOUS | Status: DC

## 2018-11-23 MED ORDER — LABETALOL HCL 5 MG/ML IV SOLN: 10.0000 mg | INTRAVENOUS | Status: DC | PRN

## 2018-11-23 MED ORDER — VERAPAMIL HCL 2.5 MG/ML IV SOLN
INTRAVENOUS | Status: AC
  Filled 2018-11-23: qty 2

## 2018-11-23 SURGICAL SUPPLY — 21 items
CATH 5FR JL3.5 JR4 ANG PIG MP (CATHETERS) ×1 IMPLANT
CATH BALLN WEDGE 5F 110CM (CATHETERS) ×1 IMPLANT
CATH INFINITI 5 FR 3DRC (CATHETERS) ×1 IMPLANT
CATH INFINITI 5 FR AR1 MOD (CATHETERS) ×1 IMPLANT
CATH INFINITI 5FR AL1 (CATHETERS) ×1 IMPLANT
CATH LAUNCHER 5F 3DRIGHT (CATHETERS) IMPLANT
CATH LAUNCHER 5F RADR (CATHETERS) IMPLANT
CATHETER LAUNCHER 5F 3DRIGHT (CATHETERS) ×2
CATHETER LAUNCHER 5F RADR (CATHETERS) ×2
DEVICE RAD COMP TR BAND LRG (VASCULAR PRODUCTS) ×1 IMPLANT
GLIDESHEATH SLEND SS 6F .021 (SHEATH) ×1 IMPLANT
GUIDEWIRE .025 260CM (WIRE) ×1 IMPLANT
GUIDEWIRE INQWIRE 1.5J.035X260 (WIRE) IMPLANT
INQWIRE 1.5J .035X260CM (WIRE) ×2
KIT HEART LEFT (KITS) ×2 IMPLANT
PACK CARDIAC CATHETERIZATION (CUSTOM PROCEDURE TRAY) ×2 IMPLANT
SHEATH GLIDE SLENDER 4/5FR (SHEATH) ×1 IMPLANT
SHEATH PROBE COVER 6X72 (BAG) ×1 IMPLANT
TRANSDUCER W/STOPCOCK (MISCELLANEOUS) ×2 IMPLANT
TUBING CIL FLEX 10 FLL-RA (TUBING) ×2 IMPLANT
WIRE HI TORQ VERSACORE-J 145CM (WIRE) ×1 IMPLANT

## 2018-11-23 NOTE — Progress Notes (Signed)
  Echocardiogram 2D Echocardiogram has been performed.  Bobbye Charleston 11/23/2018, 11:15 AM

## 2018-11-23 NOTE — Discharge Instructions (Signed)
Radial Site Care ° °This sheet gives you information about how to care for yourself after your procedure. Your health care provider may also give you more specific instructions. If you have problems or questions, contact your health care provider. °What can I expect after the procedure? °After the procedure, it is common to have: °· Bruising and tenderness at the catheter insertion area. °Follow these instructions at home: °Medicines °· Take over-the-counter and prescription medicines only as told by your health care provider. °Insertion site care °· Follow instructions from your health care provider about how to take care of your insertion site. Make sure you: °? Wash your hands with soap and water before you change your bandage (dressing). If soap and water are not available, use hand sanitizer. °? Change your dressing as told by your health care provider. °? Leave stitches (sutures), skin glue, or adhesive strips in place. These skin closures may need to stay in place for 2 weeks or longer. If adhesive strip edges start to loosen and curl up, you may trim the loose edges. Do not remove adhesive strips completely unless your health care provider tells you to do that. °· Check your insertion site every day for signs of infection. Check for: °? Redness, swelling, or pain. °? Fluid or blood. °? Pus or a bad smell. °? Warmth. °· Do not take baths, swim, or use a hot tub until your health care provider approves. °· You may shower 24-48 hours after the procedure, or as directed by your health care provider. °? Remove the dressing and gently wash the site with plain soap and water. °? Pat the area dry with a clean towel. °? Do not rub the site. That could cause bleeding. °· Do not apply powder or lotion to the site. °Activity ° °· For 24 hours after the procedure, or as directed by your health care provider: °? Do not flex or bend the affected arm. °? Do not push or pull heavy objects with the affected arm. °? Do not  drive yourself home from the hospital or clinic. You may drive 24 hours after the procedure unless your health care provider tells you not to. °? Do not operate machinery or power tools. °· Do not lift anything that is heavier than 10 lb (4.5 kg), or the limit that you are told, until your health care provider says that it is safe. °· Ask your health care provider when it is okay to: °? Return to work or school. °? Resume usual physical activities or sports. °? Resume sexual activity. °General instructions °· If the catheter site starts to bleed, raise your arm and put firm pressure on the site. If the bleeding does not stop, get help right away. This is a medical emergency. °· If you went home on the same day as your procedure, a responsible adult should be with you for the first 24 hours after you arrive home. °· Keep all follow-up visits as told by your health care provider. This is important. °Contact a health care provider if: °· You have a fever. °· You have redness, swelling, or yellow drainage around your insertion site. °Get help right away if: °· You have unusual pain at the radial site. °· The catheter insertion area swells very fast. °· The insertion area is bleeding, and the bleeding does not stop when you hold steady pressure on the area. °· Your arm or hand becomes pale, cool, tingly, or numb. °These symptoms may represent a serious problem   that is an emergency. Do not wait to see if the symptoms will go away. Get medical help right away. Call your local emergency services (911 in the [place] Do not drive yourself to the hospital. °Summary °· After the procedure, it is common to have bruising and tenderness at the site. °· Follow instructions from your health care provider about how to take care of your radial site wound. Check the wound every day for signs of infection. °· Do not lift anything that is heavier than 10 lb (4.5 kg), or the limit that you are told, until your health care provider says  that it is safe. °This information is not intended to replace advice given to you by your health care provider. Make sure you discuss any questions you have with your health care provider. °Document Released: 03/08/2010 Document Revised: 03/11/2017 Document Reviewed: 03/11/2017 °Elsevier Patient Education © 2020 [employer] Inc[employer] ° °

## 2018-11-23 NOTE — Interval H&P Note (Signed)
History and Physical Interval Note:  11/23/2018 3:46 PM  Emily Trujillo  has presented today for surgery, with the diagnosis of Aortic Stenosis.  The various methods of treatment have been discussed with the patient and family. After consideration of risks, benefits and other options for treatment, the patient has consented to  Procedure(s): RIGHT/LEFT HEART CATH AND CORONARY ANGIOGRAPHY (N/A) as a surgical intervention.  The patient's history has been reviewed, patient examined, no change in status, stable for surgery.  I have reviewed the patient's chart and labs.  Questions were answered to the patient's satisfaction.    Valvular Disease  (Right and Left Heart Catheterization or Right Heart Catheterization  Alone With or Without Left Ventriculography and Coronary Angiography) Link Here: [email or web address] Indication:  1. Preoperative assessment before valvular surgery A (7) Indication: 70; Score 7    Delberta Folts J Konya Fauble

## 2018-11-23 NOTE — Progress Notes (Signed)
Discharge instructions reviewed with pt and her daughter in law. Both voice understanding.  

## 2018-11-23 NOTE — Research (Signed)
PHDE Informed Consent   Subject Name: Emily Trujillo  Subject met inclusion and exclusion criteria.  The informed consent form, study requirements and expectations were reviewed with the subject and questions and concerns were addressed prior to the signing of the consent form.  The subject verbalized understanding of the trail requirements.  The subject agreed to participate in the PHDE trial and signed the informed consent.  The informed consent was obtained prior to performance of any protocol-specific procedures for the subject.  A copy of the signed informed consent was given to the subject and a copy was placed in the subject's medical record.  [name] 11/23/2018, 12:22 PM

## 2018-11-23 NOTE — H&P (Signed)
OV from 9/25 copied for documentation.     Patient referred by No ref. provider found for severer aortic stenosis, moderate AR.  Subjective:   Emily Trujillo, female    DOB: 19-Aug-1940, 78 y.o.   MRN: [id number]   No chief complaint on file.    HPI  78 y.o. Caucasian female with hypertension, hyperlipidemia, h/o tobacco abuse, critical aortic stenosis.  Patient lives in [place], [place]. She was admitted to [facility] in [place] in 09/2018 with progressive dyspnea. She was treated for possible pneumonia with IV antibiotics. She was also diuresed with IV lasix with improvement in symptoms. BNP was elevated at 798. Procalcitonin was normal, making bacterial pnuemonia less likely. Echocardiogram showed mod LVH, EF 55-605, critical AS, mean PG 66 mmHg. Peak vel 5.1 m/sec. AVA 0.49 cm2. Mild AI. Mild MV calcification. Mild to mod MR. Mild TR. PASP 28 mmHg.   She has felt better since hospital discharge with her breathing. She has quit smoking. However, she has gained 5 lbs in last few days. She currently lives with her son. She has to take 10 steps to get to bathroom in her house. She is able to do this without significant dyspnea. She denies any chest pain, presyncope or syncope.    Past Medical History:  Diagnosis Date  . Acute hypoxemic respiratory failure ([place])   . CHF exacerbation ([place])   . Diastolic CHF ([place])   . Dyslipidemia   . Hypertension     No past surgical history on file.   Social History   Socioeconomic History  . Marital status: Divorced    Spouse name: Not on file  . Number of children: 2  . Years of education: Not on file  . Highest education level: Not on file  Occupational History  . Not on file  Social Needs  . Financial resource strain: Not on file  . Food insecurity    Worry: Not on file    Inability: Not on file  . Transportation needs    Medical: Not on file    Non-medical: Not on file  Tobacco Use  . Smoking status: Former Smoker   Packs/day: 0.50    Years: 60.00    Pack years: 30.00    Types: Cigarettes    Quit date: 09/2018    Years since quitting: 0.1  . Smokeless tobacco: Never Used  Substance and Sexual Activity  . Alcohol use: Not Currently  . Drug use: Not on file  . Sexual activity: Not on file  Lifestyle  . Physical activity    Days per week: Not on file    Minutes per session: Not on file  . Stress: Not on file  Relationships  . Social Herbalist on phone: Not on file    Gets together: Not on file    Attends religious service: Not on file    Active member of club or organization: Not on file    Attends meetings of clubs or organizations: Not on file    Relationship status: Not on file  . Intimate partner violence    Fear of current or ex partner: Not on file    Emotionally abused: Not on file    Physically abused: Not on file    Forced sexual activity: Not on file  Other Topics Concern  . Not on file  Social History Narrative  . Not on file     Family History  Problem Relation Age of Onset  . Heart  attack Father   . Cancer Sister      No current facility-administered medications on file prior to encounter.    Current Outpatient Medications on File Prior to Encounter  Medication Sig Dispense Refill  . Calcium Carb-Cholecalciferol (CALCIUM+D3 PO) Take 1 tablet by mouth daily.    . furosemide (LASIX) 20 MG tablet Take 1 tablet (20 mg total) by mouth daily. 90 tablet 3  . lisinopril (ZESTRIL) 40 MG tablet Take 40 mg by mouth daily.    . rosuvastatin (CRESTOR) 20 MG tablet Take 20 mg by mouth every evening.       Cardiovascular studies:  EKG 11/12/2018: Sinus rhythm 54 bpm. Occasional PAC.    Left axis -anterior fascicular block.  Left ventricular hypertrophy (strain).  Nonspecific ST depression, likely due to LVH.  Echocardiogram 10/06/2018: Normal LV size. Mod LVH. EF 55-60%.  Critical AS. Mean PG 66 mmHg. Peak vel 5.1 m/sec. AVA 0.49 cm2. Mild AI. Mild MV  calcification. Mild to mod MR.  Mild TR. PASP 28 mmHg.  >50% IVC collapse.  Recent labs: 09/2018: Glucose 121, BUN/Cr 20/1.05. eGFR 51. Na/K 140/3.8. Rest of the CMP normal. H/H 11.4/36/4. MCV 96. Platelets 202.  Trop 0.05-0.07. BNP 798.   Review of Systems  Constitution: Negative for decreased appetite, malaise/fatigue, weight gain and weight loss.  HENT: Negative for congestion.   Eyes: Negative for visual disturbance.  Cardiovascular: Positive for dyspnea on exertion. Negative for chest pain, leg swelling, palpitations and syncope.  Respiratory: Negative for cough.   Endocrine: Negative for cold intolerance.  Hematologic/Lymphatic: Does not bruise/bleed easily.  Skin: Negative for itching and rash.  Musculoskeletal: Negative for myalgias.  Gastrointestinal: Negative for abdominal pain, nausea and vomiting.  Genitourinary: Negative for dysuria.  Neurological: Negative for dizziness and weakness.  Psychiatric/Behavioral: The patient is not nervous/anxious.   All other systems reviewed and are negative.        Vitals:   11/23/18 1141  BP: (!) 144/68  Pulse: 60  Resp: 16  Temp: (!) 97 F (36.1 C)  SpO2: 97%     Body mass index is 31.09 kg/m. Filed Weights   11/23/18 1141  Weight: 77.1 kg     Objective:   Physical Exam  Constitutional: She is oriented to person, place, and time. She appears well-developed and well-nourished. No distress.  HENT:  Head: Normocephalic and atraumatic.  Eyes: Pupils are equal, round, and reactive to light. Conjunctivae are normal.  Neck: No JVD present.  Cardiovascular: Normal rate, regular rhythm and intact distal pulses.  Murmur heard.  Harsh crescendo-decrescendo midsystolic murmur is present with a grade of 4/6 at the upper right sternal border radiating to the neck. Pulmonary/Chest: Effort normal and breath sounds normal. She has no wheezes. She has no rales.  Abdominal: Soft. Bowel sounds are normal. There is no rebound.   Musculoskeletal:        General: Edema (Trace b/l) present.  Lymphadenopathy:    She has no cervical adenopathy.  Neurological: She is alert and oriented to person, place, and time. No cranial nerve deficit.  Skin: Skin is warm and dry.  Psychiatric: She has a normal mood and affect.  Nursing note and vitals reviewed.         Assessment & Recommendations:   78 y.o. Caucasian female with hypertension, hyperlipidemia, stage D critical aortic stenosis.  Stage D critical aortic stenosis: AVA 0.49 cm2, mean PG 66 mmHg, Vmax 5.1 m/sec. Will try to obtain echo images from [facility]. If images  are adequate, she may not need repeat TTE/TEE. Needs urgent evaluation for possible TAVR.  I will perform right heart cath and coronary angiography and refer to structural valve clinic, to be seen after cath.  Started lasix 20 mg daily.   Hypertension: Tolerating lisinopril 40 mg. Will continue  Discussed clinical course, natural history, and treatment options at length with the patient and her son, Gerald Stabs.    Thank you for referring the patient to [place]. Please feel free to contact with any questions.  Nigel Mormon, MD [facility] Pager: [phone] Office: [phone] If no answer Cell [phone]

## 2018-11-24 ENCOUNTER — Encounter (HOSPITAL_COMMUNITY): Payer: Self-pay | Admitting: Cardiology

## 2018-11-24 ENCOUNTER — Other Ambulatory Visit: Payer: Self-pay

## 2018-11-24 DIAGNOSIS — I350 Nonrheumatic aortic (valve) stenosis: Secondary | ICD-10-CM

## 2018-11-25 ENCOUNTER — Ambulatory Visit (INDEPENDENT_AMBULATORY_CARE_PROVIDER_SITE_OTHER): Payer: Medicare Other | Admitting: Cardiovascular Disease

## 2018-11-25 ENCOUNTER — Encounter: Payer: Self-pay | Admitting: Cardiovascular Disease

## 2018-11-25 ENCOUNTER — Other Ambulatory Visit: Payer: Self-pay

## 2018-11-25 VITALS — BP 132/78 | HR 64 | Ht 62.0 in | Wt 166.0 lb

## 2018-11-25 DIAGNOSIS — I350 Nonrheumatic aortic (valve) stenosis: Secondary | ICD-10-CM

## 2018-11-25 DIAGNOSIS — N289 Disorder of kidney and ureter, unspecified: Secondary | ICD-10-CM

## 2018-11-25 NOTE — Progress Notes (Signed)
Structural Heart Clinic Consult Note  Chief Complaint  Patient presents with  . New Patient (Initial Visit)    severe aortic stenosis    History of Present Illness: 78 yo female with history of chronic diastolic CHF, HLD, HTN, tobacco abuse and severe aortic stenosis who is here today as a new consult in the structural heart clinic, referred by Dr. Virgina Jock, for further discussion of her severe aortic stenosis and possible TAVR. She was admitted to [facility] in [place], [place] August 2020 with progressive dyspnea. She was treated for possible pneumonia and diuresed with improvement in symptoms. She was seen by Dr. Virgina Jock 11/12/18. Echo with critical AS. Echo 11/23/18 with LVEF=50-55%. The aortic valve leaflets are thickened and calcified with severe aortic stenosis, mean gradient 83 mmHg, peak gradient 101 mmHg, AVA 0.75 cm2. There is at least moderate aortic valve insufficiency. There is mild mitral regurgitation. Cardiac cath 11/23/18 with mild non-obstructive disease.   She tells me today that she has been feeling much better since she has been on Lasix but still with fatigue. She has dyspnea with moderate exertion. No lower extremity edema. No chest pain. She has full dentures. She lives with her son. Retired from [employer].   Primary Care Physician: Glenda Chroman, MD Primary Cardiologist: Vernell Leep Referring Cardiologist: Vernell Leep  Past Medical History:  Diagnosis Date  . Acute hypoxemic respiratory failure ([place])   . CHF exacerbation ([place])   . Diastolic CHF ([place])   . Dyslipidemia   . Hypertension   . Severe aortic stenosis 11/12/2018    Past Surgical History:  Procedure Laterality Date  . CYST EXCISION Left    Left arm cyst  . RIGHT HEART CATH AND CORONARY ANGIOGRAPHY N/A 11/23/2018   Procedure: RIGHT HEART CATH AND CORONARY ANGIOGRAPHY;  Surgeon: Nigel Mormon, MD;  Location: [facility];  Service: Cardiovascular;  Laterality: N/A;     Current Outpatient Medications  Medication Sig Dispense Refill  . Calcium Carb-Cholecalciferol (CALCIUM+D3 PO) Take 1 tablet by mouth daily.    . furosemide (LASIX) 20 MG tablet Take 1 tablet (20 mg total) by mouth daily. 90 tablet 3  . lisinopril (ZESTRIL) 40 MG tablet Take 40 mg by mouth daily.    . rosuvastatin (CRESTOR) 20 MG tablet Take 20 mg by mouth every evening.      No current facility-administered medications for this visit.     No Known Allergies  Social History   Socioeconomic History  . Marital status: Divorced    Spouse name: Not on file  . Number of children: 2  . Years of education: Not on file  . Highest education level: Not on file  Occupational History  . Occupation: [employer]  . Financial resource strain: Not on file  . Food insecurity    Worry: Not on file    Inability: Not on file  . Transportation needs    Medical: Not on file    Non-medical: Not on file  Tobacco Use  . Smoking status: Former Smoker    Packs/day: 0.50    Years: 60.00    Pack years: 30.00    Types: Cigarettes    Quit date: 09/2018    Years since quitting: 0.1  . Smokeless tobacco: Never Used  Substance and Sexual Activity  . Alcohol use: Not Currently  . Drug use: Not on file  . Sexual activity: Not on file  Lifestyle  . Physical activity    Days per  week: Not on file    Minutes per session: Not on file  . Stress: Not on file  Relationships  . Social Herbalist on phone: Not on file    Gets together: Not on file    Attends religious service: Not on file    Active member of club or organization: Not on file    Attends meetings of clubs or organizations: Not on file    Relationship status: Not on file  . Intimate partner violence    Fear of current or ex partner: Not on file    Emotionally abused: Not on file    Physically abused: Not on file    Forced sexual activity: Not on file  Other Topics Concern  . Not on file  Social History  Narrative  . Not on file    Family History  Problem Relation Age of Onset  . Heart attack Father   . Cancer Sister     Review of Systems:  As stated in the HPI and otherwise negative.   BP 132/78   Pulse 64   Ht 5\' 2"  (1.575 m)   Wt 166 lb (75.3 kg)   SpO2 97%   BMI 30.36 kg/m   Physical Examination: General: Well developed, well nourished, NAD  HEENT: OP clear, mucus membranes moist  SKIN: warm, dry. No rashes. Neuro: No focal deficits  Musculoskeletal: Muscle strength 5/5 all ext  Psychiatric: Mood and affect normal  Neck: No JVD, no carotid bruits, no thyromegaly, no lymphadenopathy.  Lungs:Clear bilaterally, no wheezes, rhonci, crackles Cardiovascular: Regular rate and rhythm. Loud, harsh, late peaking systolic murmur.  Abdomen:Soft. Bowel sounds present. Non-tender.  Extremities: No lower extremity edema. Pulses are 2 + in the bilateral DP/PT.  EKG:  EKG is not ordered today. The ekg from 11/12/18 is reviewed by me and shows sinus with PAC, LVH  Echo 11/23/18:  1. Normal size left ventricle with mildly increased twall thickness. LVEF 50-55%. Mild inferior apical hypokineiss. LVEF 50-55%. Grade 1 diastolic dysfunction.  2. Global right ventricle has normal systolic function.The right ventricular size is normal. No increase in right ventricular wall thickness.  3. Severly calcified tricuspid aortic valve. V,max 5.6 m/sec, mean PG 83 mmHg, AVA 0.75 cm2 by continuity equation.  4. Upper limit normal aortic root at 3.8 cm.  5. Mild mitral regurgitation.  6. No TR jet to evaluate pulmonary pressure.  FINDINGS  Left Ventricle: Left ventricular ejection fraction, by visual estimation, is 50 to 55%. The left ventricle has normal function. There is mildly increased left ventricular hypertrophy. Spectral Doppler shows Left ventricular diastolic Doppler parameters  are consistent with impaired relaxation pattern of LV diastolic filling. Mild apical inferior hypokinesis.   Right Ventricle: The right ventricular size is normal. No increase in right ventricular wall thickness. Global RV systolic function is has normal systolic function.  Left Atrium: Left atrial size was normal in size.  Right Atrium: Right atrial size was normal in size  Pericardium: There is no evidence of pericardial effusion.  Mitral Valve: The mitral valve is grossly normal. Mild mitral valve regurgitation.  Tricuspid Valve: The tricuspid valve is normal in structure. Tricuspid valve regurgitation was not visualized by color flow Doppler.  Aortic Valve: The aortic valve is tricuspid. Aortic valve regurgitation is severe by color flow Doppler. Aortic valve mean gradient measures 64.7 mmHg. Aortic valve peak gradient measures 101.3 mmHg. Aortic valve area, by VTI measures 0.86 cm.  Pulmonic Valve: The pulmonic valve was  grossly normal. Pulmonic valve regurgitation is not visualized by color flow Doppler.  Aorta: Upper limit normal aortic root at 3.8 cm.  IAS/Shunts: The interatrial septum was not assessed.     LEFT VENTRICLE PLAX 2D LVIDd:         4.90 cm       Diastology LVIDs:         3.60 cm       LV e' lateral:   6.16 cm/s LV PW:         1.40 cm       LV E/e' lateral: 11.6 LV IVS:        1.00 cm       LV e' medial:    3.32 cm/s LVOT diam:     2.10 cm       LV E/e' medial:  21.6 LV SV:         58 ml LV SV Index:   31.70         2D Longitudinal Strain LVOT Area:     3.46 cm      2D Strain GLS (A2C):   20.2 %                              2D Strain GLS (A3C):   11.0 %                              2D Strain GLS (A4C):   21.4 % LV Volumes (MOD)             2D Strain GLS Avg:     17.5 % LV area d, A4C:    26.70 cm LV area s, A2C:    17.60 cm LV area s, A4C:    16.30 cm LV major d, A4C:   7.55 cm LV major s, A2C:   6.47 cm LV major s, A4C:   6.41 cm LV vol d, MOD A4C: 76.8 ml LV vol s, MOD A2C: 41.8 ml LV vol s, MOD A4C: 34.3 ml LV SV MOD A4C:     76.8 ml   RIGHT VENTRICLE             IVC RV S prime:     12.60 cm/s  IVC diam: 1.70 cm TAPSE (M-mode): 2.4 cm  LEFT ATRIUM             Index       RIGHT ATRIUM           Index LA diam:        3.70 cm 2.12 cm/m  RA Area:     14.10 cm LA Vol (A2C):   47.5 ml 27.27 ml/m RA Volume:   38.50 ml  22.10 ml/m LA Vol (A4C):   40.5 ml 23.25 ml/m LA Biplane Vol: 43.7 ml 25.09 ml/m  AORTIC VALVE AV Area (Vmax):    0.84 cm AV Area (Vmean):   0.78 cm AV Area (VTI):     0.86 cm AV Vmax:           503.33 cm/s AV Vmean:          384.667 cm/s AV VTI:            1.293 m AV Peak Grad:      101.3 mmHg AV Mean Grad:      64.7 mmHg LVOT Vmax:  122.00 cm/s LVOT Vmean:        86.900 cm/s LVOT VTI:          0.322 m LVOT/AV VTI ratio: 0.25   AORTA Ao Root diam: 3.20 cm Ao Asc diam:  3.80 cm  MITRAL VALVE MV Area (PHT): 1.61 cm              SHUNTS MV PHT:        136.88 msec           Systemic VTI:  0.32 m MV Decel Time: 472 msec              Systemic Diam: 2.10 cm MV E velocity: 71.60 cm/s  103 cm/s MV A velocity: 105.00 cm/s 70.3 cm/s MV E/A ratio:  0.68        1.5  Cardiac cath 11/23/18: LM: Large vessel. Mid focal 30% stenosis LAD: Mild luminal irregularities. Ostial Diag1 20% stenosis. LCXL: No significant abnormalities. RCA: Large vessel. Prox 40%, mid 20%, and distal 20% stenoses.    RA: 2 mmHg RV: 46/0 mmHg PA: 53/17 mmHg, mean PAP 32 mmHg PCWP: 16 mmHg  CO: 4.0 L/min CI: 2.2 L/min/m2  Diagnostic Dominance: Right  Intervention    Recent Labs: 11/12/2018: Platelets 208 11/23/2018: BUN 24; Creatinine, Ser 1.41; Hemoglobin 11.6; Hemoglobin 11.6; Potassium 3.9; Potassium 4.0; Sodium 143; Sodium 143    Wt Readings from Last 3 Encounters:  11/25/18 166 lb (75.3 kg)  11/23/18 170 lb (77.1 kg)  11/12/18 170 lb (77.1 kg)     Other studies Reviewed: Additional studies/ records that were reviewed today include: echo images, cath images, office records Review of the above  records demonstrates: severe AS with moderate AI   Assessment and Plan:   1. Severe Aortic Valve Stenosis: She has severe, stage D aortic valve stenosis. I have personally reviewed the echo images. The aortic valve is thickened, calcified with limited leaflet mobility. I think she would benefit from AVR. Given advanced age, she is not a good candidate for conventional AVR by surgical approach. I think she may be a good candidate for TAVR.   STS Risk Score: Risk of Mortality: 2.575% Renal Failure: 2.738% Permanent Stroke: 1.197% Prolonged Ventilation: 10.900% DSW Infection: 0.089% Reoperation: 3.296% Morbidity or Mortality: 14.647% Short Length of Stay: 31.935% Long Length of Stay: 6.462%  I have reviewed the natural history of aortic stenosis with the patient and their family members  who are present today. We have discussed the limitations of medical therapy and the poor prognosis associated with symptomatic aortic stenosis. We have reviewed potential treatment options, including palliative medical therapy, conventional surgical aortic valve replacement, and transcatheter aortic valve replacement. We discussed treatment options in the context of the patient's specific comorbid medical conditions.   She would like to proceed with planning for TAVR. Her cardiac cath has been completed. I have reviewed the risks of the valve procedure. We will arrange a cardiac CT, CTA of the chest/abdomen and pelvis, carotid artery dopplers, PT assessment and and she will then be referred to see one of the CT surgeons on our TAVR team.      Current medicines are reviewed at length with the patient today.  The patient does not have concerns regarding medicines.  The following changes have been made:  no change  Labs/ tests ordered today include:   Orders Placed This Encounter  Procedures  . Basic metabolic panel     Disposition:   FU with the valve team.  Signed, Lauree Chandler,  MD 11/25/2018 1:23 PM    [facility] [place], [place], [place]  [address] Phone: [phone]; Fax: [phone]

## 2018-11-25 NOTE — Patient Instructions (Signed)
Medication Instructions:  No changes today If you need a refill on your cardiac medications before your next appointment, please call your pharmacy.   Lab work: YES - BMET today  If you have labs (blood work) drawn today and your tests are completely normal, you will receive your results only by: Marland Kitchen MyChart Message (if you have MyChart) OR . A paper copy in the mail If you have any lab test that is abnormal or we need to change your treatment, we will call you to review the results.  Testing/Procedures: See letter provided today with appointments and instructions for upcoming testing

## 2018-11-26 LAB — BASIC METABOLIC PANEL
BUN/Creatinine Ratio: 18 (ref 12–28)
BUN: 22 mg/dL (ref 8–27)
CO2: 23 mmol/L (ref 20–29)
Calcium: 9.9 mg/dL (ref 8.7–10.3)
Chloride: 107 mmol/L — ABNORMAL HIGH (ref 96–106)
Creatinine, Ser: 1.25 mg/dL — ABNORMAL HIGH (ref 0.57–1.00)
GFR calc Af Amer: 47 mL/min/{1.73_m2} — ABNORMAL LOW (ref >59)
GFR calc non Af Amer: 41 mL/min/{1.73_m2} — ABNORMAL LOW (ref >59)
Glucose: 99 mg/dL (ref 65–99)
Potassium: 4.5 mmol/L (ref 3.5–5.2)
Sodium: 144 mmol/L (ref 134–144)

## 2018-11-29 ENCOUNTER — Other Ambulatory Visit: Payer: Self-pay

## 2018-11-29 ENCOUNTER — Encounter: Payer: Self-pay | Admitting: Physician Assistant

## 2018-11-29 ENCOUNTER — Ambulatory Visit (HOSPITAL_COMMUNITY)
Admission: RE | Admit: 2018-11-29 | Discharge: 2018-11-29 | Disposition: A | Discharge status: Home / Self Care | Payer: Medicare Other | Source: Ambulatory Visit | Attending: Cardiovascular Disease | Admitting: Cardiovascular Disease

## 2018-11-29 ENCOUNTER — Ambulatory Visit (HOSPITAL_BASED_OUTPATIENT_CLINIC_OR_DEPARTMENT_OTHER)
Admission: RE | Admit: 2018-11-29 | Discharge: 2018-11-29 | Disposition: A | Discharge status: Home / Self Care | Payer: Medicare Other | Source: Ambulatory Visit | Attending: Cardiovascular Disease | Admitting: Cardiovascular Disease

## 2018-11-29 DIAGNOSIS — Z01818 Encounter for other preprocedural examination: Secondary | ICD-10-CM | POA: Diagnosis not present

## 2018-11-29 DIAGNOSIS — I350 Nonrheumatic aortic (valve) stenosis: Secondary | ICD-10-CM | POA: Diagnosis not present

## 2018-11-29 DIAGNOSIS — I700 Atherosclerosis of aorta: Secondary | ICD-10-CM | POA: Diagnosis not present

## 2018-11-29 LAB — BASIC METABOLIC PANEL
Anion gap: 9 (ref 5–15)
BUN: 29 mg/dL — ABNORMAL HIGH (ref 8–23)
CO2: 25 mmol/L (ref 22–32)
Calcium: 9 mg/dL (ref 8.9–10.3)
Chloride: 108 mmol/L (ref 98–111)
Creatinine, Ser: 1.36 mg/dL — ABNORMAL HIGH (ref 0.44–1.00)
GFR calc Af Amer: 43 mL/min — ABNORMAL LOW (ref >60)
GFR calc non Af Amer: 37 mL/min — ABNORMAL LOW (ref >60)
Glucose, Bld: 107 mg/dL — ABNORMAL HIGH (ref 70–99)
Potassium: 5 mmol/L (ref 3.5–5.1)
Sodium: 142 mmol/L (ref 135–145)

## 2018-11-29 MED ORDER — SODIUM CHLORIDE 0.9 % WEIGHT BASED INFUSION: 1.0000 mL/kg/h | INTRAVENOUS | Status: DC

## 2018-11-29 MED ORDER — SODIUM CHLORIDE 0.9 % WEIGHT BASED INFUSION
3.0000 mL/kg/h | INTRAVENOUS | Status: AC
  Administered 2018-11-29: 3 mL/kg/h via INTRAVENOUS

## 2018-11-29 MED ORDER — IOHEXOL 350 MG/ML SOLN
100.0000 mL | Freq: Once | INTRAVENOUS | Status: AC | PRN
  Administered 2018-11-29: 100 mL via INTRAVENOUS

## 2018-11-29 NOTE — Progress Notes (Signed)
Carotid duplex has been completed.   Preliminary results in CV Proc.   Emily Trujillo 11/29/2018 8:16 AM

## 2018-12-01 ENCOUNTER — Other Ambulatory Visit: Payer: Self-pay

## 2018-12-01 DIAGNOSIS — I350 Nonrheumatic aortic (valve) stenosis: Secondary | ICD-10-CM

## 2018-12-02 ENCOUNTER — Ambulatory Visit: Payer: Medicare Other | Attending: Cardiovascular Disease | Admitting: Physical Therapy

## 2018-12-02 ENCOUNTER — Institutional Professional Consult (permissible substitution) (INDEPENDENT_AMBULATORY_CARE_PROVIDER_SITE_OTHER): Payer: Medicare Other | Admitting: Surgery

## 2018-12-02 ENCOUNTER — Ambulatory Visit: Payer: Medicare Other | Admitting: Cardiology

## 2018-12-02 ENCOUNTER — Other Ambulatory Visit: Payer: Self-pay

## 2018-12-02 ENCOUNTER — Encounter: Payer: Self-pay | Admitting: Physical Therapy

## 2018-12-02 ENCOUNTER — Encounter: Payer: Self-pay | Admitting: Surgery

## 2018-12-02 VITALS — BP 126/66 | HR 63 | Temp 97.5°F | Resp 20 | Ht 61.0 in | Wt 166.0 lb

## 2018-12-02 DIAGNOSIS — I350 Nonrheumatic aortic (valve) stenosis: Secondary | ICD-10-CM | POA: Diagnosis not present

## 2018-12-02 DIAGNOSIS — R2689 Other abnormalities of gait and mobility: Secondary | ICD-10-CM | POA: Insufficient documentation

## 2018-12-02 NOTE — Therapy (Signed)
[place], [place], [address] Phone: [phone]   Fax:  [phone]  Physical Therapy Evaluation  Patient Details  Name: Emily Trujillo MRN: [id number] Date of Birth: 11/02/1940 Referring Provider (PT): Lauree Chandler MD   Encounter Date: 12/02/2018  PT End of Session - 12/02/18 1144    Visit Number  1    Number of Visits  1    Date for PT Re-Evaluation  12/02/18    PT Start Time  [id number]    PT Stop Time  1218    PT Time Calculation (min)  33 min    Activity Tolerance  Patient tolerated treatment well    Behavior During Therapy  [facility] for tasks assessed/performed       Past Medical History:  Diagnosis Date  . Chronic diastolic (congestive) heart failure ([place])   . CKD (chronic kidney disease), stage III   . Diastolic CHF ([place])   . Dyslipidemia   . Hypertension   . Pulmonary nodule    1.5 x 1.1 cm macrolobulated nodule in the right lower lobe, noted on pre TAVR CT. Will require close follow up   . Severe aortic stenosis 11/12/2018  . Tobacco abuse     Past Surgical History:  Procedure Laterality Date  . CYST EXCISION Left    Left arm cyst  . RIGHT HEART CATH AND CORONARY ANGIOGRAPHY N/A 11/23/2018   Procedure: RIGHT HEART CATH AND CORONARY ANGIOGRAPHY;  Surgeon: Nigel Mormon, MD;  Location: [facility];  Service: Cardiovascular;  Laterality: N/A;    There were no vitals filed for this visit.   Subjective Assessment - 12/02/18 1152    Subjective  pt is a 78 y.o Fwith CC of dyspnea which she reports is with activity which has been going on for a couple of months. limited hx due to pt having difficulty recalling.  she reports some fatigue but is fine when she is on her on because she can go her own pace. no pain or limit    Patient Stated Goals  to get heart better    Currently in Pain?  No/denies         [facility] PT Assessment - 12/02/18 1144      Assessment   Medical  Diagnosis  Severe aortic stenosis    Referring Provider (PT)  Lauree Chandler MD    Onset Date/Surgical Date  --   couple months   Hand Dominance  Right      Precautions   Precautions  None      Restrictions   Weight Bearing Restrictions  No      Balance Screen   Has the patient fallen in the past 6 months  No    Has the patient had a decrease in activity level because of a fear of falling?   No    Is the patient reluctant to leave their home because of a fear of falling?   No      Home Environment   Living Environment  Private residence    Living Arrangements  Children    Available Help at Discharge  Family    Type of Regino Ramirez Access  Level entry    [place]  One level      ROM / Strength   AROM / PROM / Strength  AROM;Strength      AROM   Overall AROM   Within functional limits for tasks performed  Strength   Overall Strength  Within functional limits for tasks performed    Right Hand Grip (lbs)  27    Left Hand Grip (lbs)  33      Ambulation/Gait   Ambulation/Gait  Yes    Gait Pattern  Step-through pattern;Decreased stride length;Trendelenburg;Antalgic       [facility] Pre-Surgical Assessment - 12/02/18 0001    5 Meter Walk Test- trial 1  3 sec    5 Meter Walk Test- trial 2  3 sec.     5 Meter Walk Test- trial 3  3 sec.    5 meter walk test average  3 sec    4 Stage Balance Test tolerated for:   3 sec.    4 Stage Balance Test Position  3    Sit To Stand Test- trial 1  15 sec.    ADL/IADL Independent with:  Bathing;Dressing;Meal prep;Finances;Yard work    ADL/IADL Therapist, sports Index  Vulnerable    6 Minute Walk- Baseline  yes    BP (mmHg)  125/56    HR (bpm)  60    02 Sat (%RA)  96 %    Modified Borg Scale for Dyspnea  0.5- Very, very slight shortness of breath    Perceived Rate of Exertion (Borg)  7- Very, very light    6 Minute Walk Post Test  yes    HR (bpm)  96    02 Sat (%RA)  94 %    Modified Borg Scale for Dyspnea  4- somewhat severe     Perceived Rate of Exertion (Borg)  13- Somewhat hard    Aerobic Endurance Distance Walked  1110    Endurance additional comments  pt is 28.16% limited compared to age related norm              Objective measurements completed on examination: See above findings.                           Plan - 12/02/18 1145    Clinical Impression Statement  see assessment in note    Stability/Clinical Decision Making  Stable/Uncomplicated    Clinical Decision Making  Low    PT Frequency  One time visit    PT Next Visit Plan  PRE-TAVR evaluation    Consulted and Agree with Plan of Care  Patient         Clinical Impression Statement: Pt is a 78 yo F presenting to OP PT for evaluation prior to possible TAVR surgery due to severe aortic stenosis. Pt reports onset of dyspnea approximately a few months ago. Symptoms are limiting endurance. Pt presents with good ROM and strength, fair balance and is assessed as moderate at high fall risk 4 stage balance test, good walking speed and limited aerobic endurance per 6 minute walk test. Pt ambulated 1110 feet without requiring rest break. At the end of testing, patient's HR was 96 bpm and O2 was 94 on room air. Pt reported 4/10 shortness of breath on modified scale for dyspnea. Pt ambulated a total of 1110 feet in 6 minute walk. SOB, fatigue, and antalgic gait pattern increased significantly with 6 minute walk test. Based on the Short Physical Performance Battery, patient has a frailty rating of 9/12 with </= 5/12 considered frail.   Patient demonstrated the following deficits and impairments:     Visit Diagnosis: Other abnormalities of gait and mobility     Problem List Patient Active Problem  List   Diagnosis Date Noted  . Severe aortic stenosis 11/12/2018   [place] PT, DPT, LAT, ATC  12/02/18  12:23 PM      [employer] [facility] [address] [place], [place],  [address] Phone: [phone]   Fax:  [phone]  Name: Emily Trujillo MRN: [id number] Date of Birth: [birth year]

## 2018-12-02 NOTE — Progress Notes (Signed)
Patient ID: Emily Trujillo, female   DOB: August 02, 1940, 78 y.o.   MRN: [id number]  [facility] CONSULTATION REPORT  Referring Provider is Patwardhan, Reynold Bowen, MD Primary Cardiologist is No primary care provider on file. PCP is Glenda Chroman, MD   Reason for consultation: critical aortic stenosis.   HPI:  Patient is a 78 year old woman with history of hypertension, dyslipidemia, stage III chronic kidney disease, heavy smoking until recently, and chronic diastolic congestive heart failure who was admitted to the hospital in [place] in August 2020 with progressive shortness of breath.  She was treated for pneumonia and improved with diuresis.  She was seen by Dr. Virgina Jock last month and had a 2D echocardiogram showing critical aortic stenosis with a mean gradient of 83 mmHg and a peak gradient of 101 mmHg.  Aortic valve area was 0.75 cm.  Left ventricular ejection fraction was 50 to 55%.  There was felt to be at least moderate insufficiency.  There is mild mitral regurgitation.  She underwent cardiac catheterization on 11/23/2018 which showed mild nonobstructive coronary disease.  She is here today with her daughter-in-law whom she lives with in addition to her son.  She has felt better since being placed on Lasix.  She continues to have exertional fatigue and mild exertional shortness of breath.  She has had no orthopnea.  She denies peripheral edema.  She has had no chest pain or pressure.  She denies any dizziness or syncope.  Past Medical History:  Diagnosis Date   Chronic diastolic (congestive) heart failure (HCC)    CKD (chronic kidney disease), stage III    Diastolic CHF (HCC)    Dyslipidemia    Hypertension    Pulmonary nodule    1.5 x 1.1 cm macrolobulated nodule in the right lower lobe, noted on pre TAVR CT. Will require close follow up    Severe aortic stenosis 11/12/2018   Tobacco  abuse     Past Surgical History:  Procedure Laterality Date   CYST EXCISION Left    Left arm cyst   RIGHT HEART CATH AND CORONARY ANGIOGRAPHY N/A 11/23/2018   Procedure: RIGHT HEART CATH AND CORONARY ANGIOGRAPHY;  Surgeon: Nigel Mormon, MD;  Location: [facility];  Service: Cardiovascular;  Laterality: N/A;    Family History  Problem Relation Age of Onset   Heart attack Father    Cancer Sister     Social History   Socioeconomic History   Marital status: Divorced    Spouse name: Not on file   Number of children: 2   Years of education: Not on file   Highest education level: Not on file  Occupational History   Occupation: Retired-[employer] visitor strain: Not on file   Food insecurity    Worry: Not on file    Inability: Not on file   Transportation needs    Medical: Not on file    Non-medical: Not on file  Tobacco Use   Smoking status: Former Smoker    Packs/day: 0.50    Years: 60.00    Pack years: 30.00    Types: Cigarettes    Quit date: 09/2018    Years since quitting: 0.2   Smokeless tobacco: Never Used  Substance and Sexual Activity   Alcohol use: Not Currently   Drug use: Not on file   Sexual activity: Not on file  Lifestyle   Physical activity  Days per week: Not on file    Minutes per session: Not on file   Stress: Not on file  Relationships   Social connections    Talks on phone: Not on file    Gets together: Not on file    Attends religious service: Not on file    Active member of club or organization: Not on file    Attends meetings of clubs or organizations: Not on file    Relationship status: Not on file   Intimate partner violence    Fear of current or ex partner: Not on file    Emotionally abused: Not on file    Physically abused: Not on file    Forced sexual activity: Not on file  Other Topics Concern   Not on file  Social History Narrative   Not on file    Current  Outpatient Medications  Medication Sig Dispense Refill   Calcium Carb-Cholecalciferol (CALCIUM+D3 PO) Take 1 tablet by mouth daily.     furosemide (LASIX) 20 MG tablet Take 1 tablet (20 mg total) by mouth daily. 90 tablet 3   lisinopril (ZESTRIL) 40 MG tablet Take 40 mg by mouth daily.     rosuvastatin (CRESTOR) 20 MG tablet Take 20 mg by mouth every evening.      No current facility-administered medications for this visit.     No Known Allergies    Review of Systems:   General:  normal appetite, + decreased energy, no weight gain, no weight loss, no fever  Cardiac:  no chest pain with exertion, no chest pain at rest, +SOB with moderate exertion, no resting SOB, no PND, no orthopnea, no palpitations, no arrhythmia, no atrial fibrillation, no LE edema, no dizzy spells, no syncope  Respiratory:  + shortness of breath, no home oxygen, no productive cough, no dry cough, no bronchitis, no wheezing, no hemoptysis, no asthma, no pain with inspiration or cough, no sleep apnea, no CPAP at night  GI:   no difficulty swallowing, no reflux, no frequent heartburn, no hiatal hernia, no abdominal pain, no constipation, no diarrhea, no hematochezia, no hematemesis, no melena  GU:   no dysuria,  no frequency, no urinary tract infection, no hematuria, no kidney stones, no kidney disease  Vascular:  no pain suggestive of claudication, no pain in feet, no leg cramps, no varicose veins, no DVT, no non-healing foot ulcer  Neuro:   no stroke, no TIA's, no seizures, no headaches, no temporary blindness one eye,  no slurred speech, no peripheral neuropathy, no chronic pain, no instability of gait, no memory/cognitive dysfunction  Musculoskeletal: no arthritis, no joint swelling, no myalgias, no difficulty walking, normal mobility   Skin:   no rash, no itching, no skin infections, no pressure sores or ulcerations  Psych:   no anxiety, no depression, no nervousness, no unusual recent stress  Eyes:   no blurry  vision, no floaters, no recent vision changes, + wears glasses or contacts  ENT:   + hearing loss, no loose or painful teeth, wears dentures, last saw dentist years ago  Hematologic:  no easy bruising, no abnormal bleeding, no clotting disorder, no frequent epistaxis  Endocrine:  no diabetes, does not check CBG's at home           Physical Exam:   There were no vitals taken for this visit.  General:  Elderly but  well-appearing  HEENT:  Unremarkable, NCAT, PERLA, EOMI  Neck:   no JVD, no bruits, no adenopathy  Chest:   clear to auscultation, symmetrical breath sounds, no wheezes, no rhonchi   CV:   RRR, grade lll/VI crescendo/decrescendo murmur heard best at RSB,  no diastolic murmur  Abdomen:  soft, non-tender, no masses   Extremities:  warm, well-perfused, pulses palpable in feet, no LE edema  Rectal/GU  Deferred  Neuro:   Grossly non-focal and symmetrical throughout  Skin:   Clean and dry, no rashes, no breakdown   Diagnostic Tests:  ECHOCARDIOGRAM REPORT       Patient Name:   Emily Trujillo Date of Exam: 11/23/2018 Medical Rec #:  [id number]      Height:       60.0 in Accession #:    [id number]     Weight:       170.0 lb Date of Birth:  21-Sep-1940      BSA:          1.74 m Patient Age:    74 years       BP:           171/83 mmHg Patient Gender: F              HR:           67 bpm. Exam Location:  Outpatient  Procedure: 2D Echo, Cardiac Doppler and Color Doppler  Indications:     I35.0 Nonrheumatic aortic (valve) stenosis   History:         Patient has no prior history of Echocardiogram examinations.                  Aortic Valve Disease Signs/Symptoms:Dyspnea Risk                  Factors:Hypertension. Critical aortic stenosis.   Sonographer:     Roseanna Rainbow Referring Phys:  [id number] [facility] J PATWARDHAN Diagnosing Phys: Vernell Leep MD  IMPRESSIONS    1. Normal size left ventricle with mildly increased twall thickness. LVEF 50-55%. Mild inferior  apical hypokineiss. LVEF 50-55%. Grade 1 diastolic dysfunction.  2. Global right ventricle has normal systolic function.The right ventricular size is normal. No increase in right ventricular wall thickness.  3. Severly calcified tricuspid aortic valve. V,max 5.6 m/sec, mean PG 83 mmHg, AVA 0.75 cm2 by continuity equation.  4. Upper limit normal aortic root at 3.8 cm.  5. Mild mitral regurgitation.  6. No TR jet to evaluate pulmonary pressure.  FINDINGS  Left Ventricle: Left ventricular ejection fraction, by visual estimation, is 50 to 55%. The left ventricle has normal function. There is mildly increased left ventricular hypertrophy. Spectral Doppler shows Left ventricular diastolic Doppler parameters  are consistent with impaired relaxation pattern of LV diastolic filling. Mild apical inferior hypokinesis.  Right Ventricle: The right ventricular size is normal. No increase in right ventricular wall thickness. Global RV systolic function is has normal systolic function.  Left Atrium: Left atrial size was normal in size.  Right Atrium: Right atrial size was normal in size  Pericardium: There is no evidence of pericardial effusion.  Mitral Valve: The mitral valve is grossly normal. Mild mitral valve regurgitation.  Tricuspid Valve: The tricuspid valve is normal in structure. Tricuspid valve regurgitation was not visualized by color flow Doppler.  Aortic Valve: The aortic valve is tricuspid. Aortic valve regurgitation is severe by color flow Doppler. Aortic valve mean gradient measures 64.7 mmHg. Aortic valve peak gradient measures 101.3 mmHg. Aortic valve area, by VTI measures 0.86 cm.  Pulmonic Valve: The pulmonic valve was grossly normal.  Pulmonic valve regurgitation is not visualized by color flow Doppler.  Aorta: Upper limit normal aortic root at 3.8 cm.  IAS/Shunts: The interatrial septum was not assessed.     LEFT VENTRICLE PLAX 2D LVIDd:         4.90 cm        Diastology LVIDs:         3.60 cm       LV e' lateral:   6.16 cm/s LV PW:         1.40 cm       LV E/e' lateral: 11.6 LV IVS:        1.00 cm       LV e' medial:    3.32 cm/s LVOT diam:     2.10 cm       LV E/e' medial:  21.6 LV SV:         58 ml LV SV Index:   31.70         2D Longitudinal Strain LVOT Area:     3.46 cm      2D Strain GLS (A2C):   20.2 %                              2D Strain GLS (A3C):   11.0 %                              2D Strain GLS (A4C):   21.4 % LV Volumes (MOD)             2D Strain GLS Avg:     17.5 % LV area d, A4C:    26.70 cm LV area s, A2C:    17.60 cm LV area s, A4C:    16.30 cm LV major d, A4C:   7.55 cm LV major s, A2C:   6.47 cm LV major s, A4C:   6.41 cm LV vol d, MOD A4C: 76.8 ml LV vol s, MOD A2C: 41.8 ml LV vol s, MOD A4C: 34.3 ml LV SV MOD A4C:     76.8 ml  RIGHT VENTRICLE             IVC RV S prime:     12.60 cm/s  IVC diam: 1.70 cm TAPSE (M-mode): 2.4 cm  LEFT ATRIUM             Index       RIGHT ATRIUM           Index LA diam:        3.70 cm 2.12 cm/m  RA Area:     14.10 cm LA Vol (A2C):   47.5 ml 27.27 ml/m RA Volume:   38.50 ml  22.10 ml/m LA Vol (A4C):   40.5 ml 23.25 ml/m LA Biplane Vol: 43.7 ml 25.09 ml/m  AORTIC VALVE AV Area (Vmax):    0.84 cm AV Area (Vmean):   0.78 cm AV Area (VTI):     0.86 cm AV Vmax:           503.33 cm/s AV Vmean:          384.667 cm/s AV VTI:            1.293 m AV Peak Grad:      101.3 mmHg AV Mean Grad:      64.7 mmHg LVOT Vmax:  122.00 cm/s LVOT Vmean:        86.900 cm/s LVOT VTI:          0.322 m LVOT/AV VTI ratio: 0.25   AORTA Ao Root diam: 3.20 cm Ao Asc diam:  3.80 cm  MITRAL VALVE MV Area (PHT): 1.61 cm              SHUNTS MV PHT:        136.88 msec           Systemic VTI:  0.32 m MV Decel Time: 472 msec              Systemic Diam: 2.10 cm MV E velocity: 71.60 cm/s  103 cm/s MV A velocity: 105.00 cm/s 70.3 cm/s MV E/A ratio:  0.68        1.5    Manish  Patwardhan MD Electronically signed by Vernell Leep MD Signature Date/Time: 11/23/2018/11:35:00 AM    Physicians  Panel Physicians Referring Physician Case Authorizing Physician  Patwardhan, Reynold Bowen, MD (Primary)    Procedures  RIGHT HEART CATH AND CORONARY ANGIOGRAPHY  Conclusion  LM: Large vessel. Mid focal 30% stenosis LAD: Mild luminal irregularities. Ostial Diag1 20% stenosis. LCXL: No significant abnormalities. RCA: Large vessel. Prox 40%, mid 20%, and distal 20% stenoses.    RA: 2 mmHg RV: 46/0 mmHg PA: 53/17 mmHg, mean PAP 32 mmHg PCWP: 16 mmHg  CO: 4.0 L/min CI: 2.2 L/min/m2  Recommendation: TAVR consult Medical management for CAD.   Recommendations  Antiplatelet/Anticoag Recommend Aspirin 81mg  daily for moderate CAD. TAVR consult  Indications  Severe aortic stenosis [I35.0 (ICD-10-CM)]  Procedural Details  Technical Details Procedures: 1. Ultrasound guided right radial artery access 2. Right heart catheterization 3. Selective left and right coronary angiography 4. Conscious sedation monitoring 61 min  Indication: Critical aortic stenosis  History: 78 y.o. Caucasian female with hypertension, hyperlipidemia, stage D critical aortic stenosis.  Right radial artery visualized under ultrasound guidance. Vessel and flow identified. Image placed on the chart. Successful ultrasound guided right radial artery access.   Diagnostic Angiography: Catheter/s advances over guidewire under fluoroscopy Left coronary artery: 5 Fr JL 3.5  Right coronary artery: Multiple catheters uses. 5 Fr AR-1 Extremely tortuous radial, brachial artery, and aorta. Difficult procedure due to tortuosity.    Pressures tracings obtained in right atrium, right ventricle, pulmonary artery, and pulmonary capillary wedge position.   Anticoagulation:  4000 units heparin Hemostasis: TR band  Total contrast used: 80 cc   Total fluoro time: 19.0 min Air Kerma: 261  mGy  All wires and catheters removed out of the body at the end of the procedure Final angiogram showed no dissection/perforation         Estimated blood loss <50 mL.   During this procedure medications were administered to achieve and maintain moderate conscious sedation while the patient's heart rate, blood pressure, and oxygen saturation were continuously monitored and I was present face-to-face 100% of this time.  Medications (Filter: Administrations occurring from 11/23/18 1539 to 11/23/18 1709) Medication Rate/Dose/Volume Action  Date Time   lidocaine (PF) (XYLOCAINE) 1 % injection (mL) 2 mL Given 11/23/18 1550   Total dose as of 11/23/18 1709 2 mL Given 1555   4 mL        Radial Cocktail/Verapamil only (mL) 10 mL Given 11/23/18 1557   Total dose as of 11/23/18 1709        10 mL        fentaNYL (SUBLIMAZE) injection (mcg) 12.5 mcg  Given 11/23/18 1558   Total dose as of 11/23/18 1709 12.5 mcg Given 1630   25 mcg        midazolam (VERSED) injection (mg) 0.5 mg Given 11/23/18 1559   Total dose as of 11/23/18 1709 0.5 mg Given 1630   1 mg        Heparin (Porcine) in NaCl 1000-0.9 UT/500ML-% SOLN (mL) 500 mL Given 11/23/18 1612   Total dose as of 11/23/18 1709 500 mL Given 1612   1,000 mL        nitroGLYCERIN 1 mg/10 mL (100 mcg/mL) - IR/CATH LAB (mcg) 100 mcg Given 11/23/18 1641   Total dose as of 11/23/18 1709        100 mcg        heparin injection (Units) 4,000 Units Given 11/23/18 1701   Total dose as of 11/23/18 1709        4,000 Units        iohexol (OMNIPAQUE) 350 MG/ML injection (mL) 80 mL Given 11/23/18 1705   Total dose as of 11/23/18 1709        80 mL        Sedation Time  Sedation Time Physician-1: 1 hour 1 minute 11 seconds  Contrast  Medication Name Total Dose  iohexol (OMNIPAQUE) 350 MG/ML injection 80 mL    Radiation/Fluoro  Fluoro time: 19 (min) DAP: [id number] (mGycm2) Cumulative Air Kerma: 0000000 (mGy)  Complications  Complications documented  before study signed (11/23/2018 5:28 PM)   RIGHT HEART CATH AND CORONARY ANGIOGRAPHY  None Documented by Nigel Mormon, MD 11/23/2018 5:20 PM  Date Found: 11/23/2018  Time Range: Intraprocedure      Coronary Findings  Diagnostic Dominance: Right Left Main  Mid LM lesion 30% stenosed  Mid LM lesion is 30% stenosed. The lesion is focal.  Left Anterior Descending  First Diagonal Branch  1st Diag lesion 20% stenosed  1st Diag lesion is 20% stenosed.  Left Circumflex  Vessel is normal in caliber. Vessel is angiographically normal.  Right Coronary Artery  Prox RCA lesion 40% stenosed  Prox RCA lesion is 40% stenosed.  Mid RCA-1 lesion 20% stenosed  Mid RCA-1 lesion is 20% stenosed.  Mid RCA-2 lesion 20% stenosed  Mid RCA-2 lesion is 20% stenosed.  Dist RCA lesion 20% stenosed  Dist RCA lesion is 20% stenosed.  Intervention  No interventions have been documented. Right Heart  Right Heart Pressures RA: 2 mmHg RV: 46/0 mmHg PA: 53/17 mmHg, mean PAP 32 mmHg PCWP: 16 mmHg  CO: 4.0 L/min CI: 2.2 L/min/m2  Left Heart  Left Ventricle LV not entered  Coronary Diagrams  Diagnostic Dominance: Right  Intervention  Implants   No implant documentation for this case.  Syngo Images  Show images for CARDIAC CATHETERIZATION  Images on Long Term Storage  Show images for Marchia, Rober to Procedure Log  Procedure Log    Hemo Data   Most Recent Value  Fick Cardiac Output 4.08 L/min  Fick Cardiac Output Index 2.29 (L/min)/BSA  RA A Wave 8 mmHg  RA V Wave 3 mmHg  RA Mean 2 mmHg  RV Systolic Pressure 46 mmHg  RV Diastolic Pressure 0 mmHg  RV EDP 5 mmHg  PA Systolic Pressure 53 mmHg  PA Diastolic Pressure 17 mmHg  PA Mean 32 mmHg  PW A Wave 25 mmHg  PW V Wave 22 mmHg  PW Mean 16 mmHg  AO Systolic Pressure 123456 mmHg  AO Diastolic Pressure 61  mmHg  AO Mean 91 mmHg  QP/QS 1  TPVR Index 13.98 HRUI  TSVR Index 39.76 HRUI  PVR SVR Ratio 0.18   TPVR/TSVR Ratio 0.35    ADDENDUM REPORT: 11/30/2018 09:59  CLINICAL DATA:  78 year old female with severe aortic stenosis being evaluated for a TAVR procedure.  EXAM: Cardiac TAVR CT  TECHNIQUE: The patient was scanned on a [employer]. A 120 kV retrospective scan was triggered in the descending thoracic aorta at 111 HU's. Gantry rotation speed was 250 msecs and collimation was .6 mm. No beta blockade or nitro were given. The 3D data set was reconstructed in 5% intervals of the R-R cycle. Systolic and diastolic phases were analyzed on a dedicated work station using MPR, MIP and VRT modes. The patient received 80 cc of contrast.  FINDINGS: Aortic Valve: Trileaflet aortic valve with severe asymmetric calcifications of the non-coronary leaflet but no calcifications extending into the LVOT.  Aorta: Ascending aorta is ectatic with maximum diameter 40 mm. There are mild diffuse calcifications and atherosclerotic plaque in the ascending aorta and arch and moderate to severe protruding plaque in the descending aorta. No dissection.  Sinotubular Junction: 30 x 30 mm  Ascending Thoracic Aorta: 40 x 40 mm  Aortic Arch: 26 x 24 mm  Descending Thoracic Aorta: 26 x 26 mm  Sinus of Valsalva Measurements:  Non-coronary: 32 mm  Right -coronary: 30 mm  Left -coronary: 31 mm  Coronary Artery Height above Annulus:  Left Main: 8.8 mm  Right Coronary: 14 mm  Virtual Basal Annulus Measurements:  Maximum/Minimum Diameter: 25.4 x 19.7 mm  Mean Diameter: 22.3 mm  Perimeter: 72.1 mm  Area: 391 mm2  Optimum Fluoroscopic Angle for Delivery: LAO 9 CAU 6  IMPRESSION: 1. Trileaflet aortic valve with severe asymmetric calcifications of the non-coronary leaflet and calcifications extending into the LVOT. Aortic valve calcium score 4981. Annular measurements suitable for delivery of a 23 mm Edwards-SAPIEN 3 valve.  2. Sufficient RCA to annulus  distance. Borderline LM to annulus distance measuring 9 mm.  3. Optimum Fluoroscopic Angle for Delivery: LAO 9 CAU 6  4. No thrombus in the left atrial appendage.  5. Ascending aorta is ectatic with maximum diameter 40 mm.   Electronically Signed   By: Ena Dawley   On: 11/30/2018 09:59   Addended by Dorothy Spark, MD on 11/30/2018 10:01 AM    Study Result  EXAM: OVER-READ INTERPRETATION  CT CHEST  The following report is an over-read performed by radiologist Dr. Vinnie Langton of [facility], [place] on 11/29/2018. This over-read does not include interpretation of cardiac or coronary anatomy or pathology. The coronary calcium score/coronary CTA interpretation by the cardiologist is attached.  COMPARISON:  None.  FINDINGS: Extracardiac findings will be described separately under dictation for contemporaneously obtained CTA chest, abdomen and pelvis.  IMPRESSION: Please see separate dictation for contemporaneously obtained CTA chest, abdomen and pelvis dated 11/29/2018 for full description of relevant extracardiac findings.  Electronically Signed: By: Vinnie Langton M.D. On: 11/29/2018 11:49       CLINICAL DATA:  78 year old female with history of severe aortic valve stenosis. Preprocedural study prior to potential transcatheter aortic valve replacement (TAVR) procedure.  EXAM: CT ANGIOGRAPHY CHEST, ABDOMEN AND PELVIS  TECHNIQUE: Multidetector CT imaging through the chest, abdomen and pelvis was performed using the standard protocol during bolus administration of intravenous contrast. Multiplanar reconstructed images and MIPs were obtained and reviewed to evaluate the vascular anatomy.  CONTRAST:  140mL OMNIPAQUE IOHEXOL 350  MG/ML SOLN  COMPARISON:  None.  FINDINGS: CTA CHEST FINDINGS  Cardiovascular: Heart size is mildly enlarged. There is no significant pericardial fluid, thickening or pericardial calcification.  There is aortic atherosclerosis, as well as atherosclerosis of the great vessels of the mediastinum and the coronary arteries, including calcified atherosclerotic plaque in the left anterior descending, left circumflex and right coronary arteries.  Mediastinum/Lymph Nodes: No pathologically enlarged mediastinal or hilar lymph nodes. Esophagus is unremarkable in appearance. No axillary lymphadenopathy.  Lungs/Pleura: 1.5 x 1.1 cm macrolobulated nodule in the right lower lobe (axial image 72 of series 5). No acute consolidative airspace disease. No pleural effusions.  Musculoskeletal/Soft Tissues: There are no aggressive appearing lytic or blastic lesions noted in the visualized portions of the skeleton.  CTA ABDOMEN AND PELVIS FINDINGS  Hepatobiliary: No suspicious cystic or solid hepatic lesions. No intra or extrahepatic biliary ductal dilatation. Gallbladder is normal in appearance.  Pancreas: No pancreatic mass. No pancreatic ductal dilatation. No pancreatic or peripancreatic fluid collections or inflammatory changes.  Spleen: Unremarkable.  Adrenals/Urinary Tract: 8 mm nonobstructive calculus in the lower pole collecting system of the right kidney. Multiple low-attenuation lesions in both kidneys, compatible with simple cysts, largest of which is a 3.7 cm exophytic lesion in the lower pole of the right kidney. Other subcentimeter low-attenuation lesions in both kidneys are too small to definitively characterize, but are favored to represent tiny cysts. No hydroureteronephrosis. Urinary bladder is normal in appearance.  Stomach/Bowel: Normal appearance of the stomach. No pathologic dilatation of small bowel or colon. Numerous colonic diverticulae are noted, particularly in the descending colon and sigmoid colon, without surrounding inflammatory changes to suggest an acute diverticulitis at this time. The appendix is not confidently identified and may be  surgically absent. Regardless, there are no inflammatory changes noted adjacent to the cecum to suggest the presence of an acute appendicitis at this time.  Vascular/Lymphatic: Aortic atherosclerosis, without evidence of aneurysm or dissection in the abdominal or pelvic vasculature. Vascular findings and measurements pertinent to potential TAVR procedure, as detailed below. No lymphadenopathy noted in the abdomen or pelvis.  Reproductive: Uterus and ovaries are trophic.  Other: No significant volume of ascites.  No pneumoperitoneum.  Musculoskeletal: There are no aggressive appearing lytic or blastic lesions noted in the visualized portions of the skeleton.  VASCULAR MEASUREMENTS PERTINENT TO TAVR:  AORTA:  Minimal Aortic Diameter-14 x 12 mm  Severity of Aortic Calcification-moderate to severe  RIGHT PELVIS:  Right Common Iliac Artery -  Minimal Diameter-10.4 x 8.4 mm  Tortuosity - mild  Calcification-moderate to severe  Right External Iliac Artery -  Minimal Diameter-6.6 x 7.3 mm  Tortuosity - mild-to-moderate  Calcification-minimal  Right Common Femoral Artery -  Minimal Diameter-5.7 x 6.6 mm  Tortuosity - mild  Calcification-mild  LEFT PELVIS:  Left Common Iliac Artery -  Minimal Diameter-7.3 x 7.1 mm  Tortuosity - mild  Calcification-moderate to severe  Left External Iliac Artery -  Minimal Diameter-6.8 x 4.6 mm  Tortuosity - mild  Calcification-minimal  Left Common Femoral Artery -  Minimal Diameter-6.5 x 6.0 mm  Tortuosity - mild  Calcification-mild  Review of the MIP images confirms the above findings.  IMPRESSION: 1. Vascular findings and measurements pertinent to potential TAVR procedure, as detailed above. 2. Severe thickening and calcification of the aortic valve, compatible with reported clinical history of severe aortic stenosis. 3. Aortic atherosclerosis, in addition to 3 vessel  coronary artery disease. 4. 1.5 x 1.1 cm macrolobulated nodule in the right  lower lobe. This is nonspecific, but the possibility of a primary bronchogenic neoplasm or solitary metastasis should be considered. Consider one of the following in 3 months for both low-risk and high-risk individuals: (a) repeat chest CT or (b) follow-up PET-CT. This recommendation follows the consensus statement: Guidelines for Management of Incidental Pulmonary Nodules Detected on CT Images: From the Fleischner Society 2017; Radiology 2017; 284:228-243. 5. 8 mm nonobstructive calculus in the lower pole collecting system of the right kidney. 6. Colonic diverticulosis without evidence of acute diverticulitis at this time. 7. Additional incidental findings, as above.   Electronically Signed   By: Vinnie Langton M.D.   On: 11/29/2018 13:54   STS Risk Score: Risk of Mortality: 2.575% Renal Failure: 2.738% Permanent Stroke: 1.197% Prolonged Ventilation: 10.900% DSW Infection: 0.089% Reoperation: 3.296% Morbidity or Mortality: 14.647% Short Length of Stay: 31.935% Long Length of Stay: 6.462%   Impression:  This 78 year old woman has stage D, severe, symptomatic aortic stenosis with New York Heart Association class III symptoms of exertional fatigue and shortness of breath consistent with chronic diastolic congestive heart failure.  I have personally reviewed her 2D echocardiogram, cardiac catheterization, and CTA studies.  Echocardiogram shows a trileaflet aortic valve with severe calcification particularly of the noncoronary leaflet with restricted mobility.  The mean gradient is 83 mmHg consistent with critical aortic stenosis.  Left ventricular systolic function is normal.  Cardiac catheterization showed mild nonobstructive coronary disease.  I agree that aortic valve replacement is indicated in this patient with critical aortic stenosis.  Given her age transcatheter aortic valve replacement  would be the best treatment for her.  Her gated cardiac CTA shows anatomy suitable for transcatheter aortic valve replacement using a Sapien 3 valve.  Left main high was only measured at 8.8 mm but her sinus dimension is 31 mm and there is minimal calcification in the left coronary leaflet.  Her abdominal and pelvic CTA shows adequate pelvic vascular anatomy to allow transfemoral insertion.  A significant incidental finding on her CT scan of the chest is that she has a 1.5 cm x 1.1 cm macrolobulated nodule in the right lower lobe of the lung that is nonspecific but primary bronchogenic carcinoma will have to be ruled out.  I think it would be best to proceed with transcatheter aortic valve replacement given the critical nature of her aortic stenosis with follow-up of the lung nodule with a PET CT scan in 3 months.  I reviewed the CT images with the patient and her daughter-in-law and answered all their questions concerning this.  The patient and her daughter-in-law were counseled at length regarding treatment alternatives for management of severe symptomatic aortic stenosis. The risks and benefits of surgical intervention has been discussed in detail. Long-term prognosis with medical therapy was discussed. Alternative approaches such as conventional surgical aortic valve replacement, transcatheter aortic valve replacement, and palliative medical therapy were compared and contrasted at length. This discussion was placed in the context of the patient's own specific clinical presentation and past medical history. All of their questions have been addressed.   Following the decision to proceed with transcatheter aortic valve replacement, a discussion was held regarding what types of management strategies would be attempted intraoperatively in the event of life-threatening complications, including whether or not the patient would be considered a candidate for the use of cardiopulmonary bypass and/or conversion to open  sternotomy for attempted surgical intervention.  I think she is a candidate for emergent sternotomy intraoperative complications.    The patient  has been advised of a variety of complications that might develop including but not limited to risks of death, stroke, paravalvular leak, aortic dissection or other major vascular complications, aortic annulus rupture, device embolization, cardiac rupture or perforation, mitral regurgitation, acute myocardial infarction, arrhythmia, heart block or bradycardia requiring permanent pacemaker placement, congestive heart failure, respiratory failure, renal failure, pneumonia, infection, other late complications related to structural valve deterioration or migration, or other complications that might ultimately cause a temporary or permanent loss of functional independence or other long term morbidity. The patient provides full informed consent for the procedure as described and all questions were answered.    Plan:  Transfemoral transcatheter placement on 12/15/2018.  The right lower lobe lung nodule will be worked up with a PET CT scan in 3 months.    I spent 60 minutes performing this consultation and > 50% of this time was spent face to face counseling and coordinating the care of this patient's severe symptomatic aortic stenosis.   [name], MD 12/02/2018

## 2018-12-02 NOTE — Progress Notes (Signed)
[place], [place] [place] [address] Phone: [phone] Fax: [phone]      Your procedure is scheduled on December 07, 2018.  Report to [facility] Main Entrance "A" at 8:45 A.M., and check in at the Admitting office.  Call this number if you have problems the morning of surgery:  [phone]  Call [phone] if you have any questions prior to your surgery date Monday-Friday 8am-4pm    Remember:  Do not eat or drink after midnight the night before your surgery    Take these medicines the morning of surgery with A SIP OF WATER:  NONE  As of today, STOP taking any Aspirin (unless otherwise instructed by your surgeon), Aleve, Naproxen, Ibuprofen, Motrin, Advil, Goody's, BC's, all herbal medications, fish oil, and all vitamins.    The Morning of Surgery  Do not wear jewelry, make-up or nail polish.  Do not wear lotions, powders, or perfumes, or deodorant  Do not shave 48 hours prior to surgery.    Do not bring valuables to the hospital.  [facility] is not responsible for any belongings or valuables.  If you are a smoker, DO NOT Smoke 24 hours prior to surgery IF you wear a CPAP at night please bring your mask, tubing, and machine the morning of surgery   Remember that you must have someone to transport you home after your surgery, and remain with you for 24 hours if you are discharged the same day.   Contacts, glasses, hearing aids, dentures or bridgework may not be worn into surgery.    Leave your suitcase in the car.  After surgery it may be brought to your room.  For patients admitted to the hospital, discharge time will be determined by your treatment team.  Patients discharged the day of surgery will not be allowed to drive home.    Special instructions:   [facility]- Preparing For Surgery  Before surgery, you can play an important role. Because skin is not sterile, your skin needs  to be as free of germs as possible. You can reduce the number of germs on your skin by washing with CHG (chlorahexidine gluconate) Soap before surgery.  CHG is an antiseptic cleaner which kills germs and bonds with the skin to continue killing germs even after washing.    Oral Hygiene is also important to reduce your risk of infection.  Remember - BRUSH YOUR TEETH THE MORNING OF SURGERY WITH YOUR REGULAR TOOTHPASTE  Please do not use if you have an allergy to CHG or antibacterial soaps. If your skin becomes reddened/irritated stop using the CHG.  Do not shave (including legs and underarms) for at least 48 hours prior to first CHG shower. It is OK to shave your face.  Please follow these instructions carefully.   1. Shower the NIGHT BEFORE SURGERY and the MORNING OF SURGERY with CHG Soap.   2. If you chose to wash your hair, wash your hair first as usual with your normal shampoo.  3. After you shampoo, rinse your hair and body thoroughly to remove the shampoo.  4. Use CHG as you would any other liquid soap. You can apply CHG directly to the skin and wash gently with a scrungie or a clean washcloth.   5. Apply the CHG Soap to your body ONLY FROM THE NECK DOWN.  Do not use on open wounds or open sores. Avoid contact with your eyes, ears, mouth and genitals (  private parts). Wash Face and genitals (private parts)  with your normal soap.   6. Wash thoroughly, paying special attention to the area where your surgery will be performed.  7. Thoroughly rinse your body with warm water from the neck down.  8. DO NOT shower/wash with your normal soap after using and rinsing off the CHG Soap.  9. Pat yourself dry with a CLEAN TOWEL.  10. Wear CLEAN PAJAMAS to bed the night before surgery, wear comfortable clothes the morning of surgery  11. Place CLEAN SHEETS on your bed the night of your first shower and DO NOT SLEEP WITH PETS.    Day of Surgery:  Do not apply any deodorants/lotions. Please  shower the morning of surgery with the CHG soap  Please wear clean clothes to the hospital/surgery center.   Remember to brush your teeth WITH YOUR REGULAR TOOTHPASTE.   Please read over the following fact sheets that you were given.

## 2018-12-03 ENCOUNTER — Encounter (HOSPITAL_COMMUNITY)
Admission: RE | Admit: 2018-12-03 | Discharge: 2018-12-03 | Disposition: A | Discharge status: Home / Self Care | Payer: Medicare Other | Source: Ambulatory Visit | Attending: Cardiovascular Disease | Admitting: Cardiovascular Disease

## 2018-12-03 ENCOUNTER — Other Ambulatory Visit (HOSPITAL_COMMUNITY): Payer: Medicare Other

## 2018-12-03 ENCOUNTER — Other Ambulatory Visit (HOSPITAL_COMMUNITY)
Admission: RE | Admit: 2018-12-03 | Discharge: 2018-12-03 | Disposition: A | Discharge status: Home / Self Care | Payer: Medicare Other | Source: Ambulatory Visit | Attending: Cardiovascular Disease | Admitting: Cardiovascular Disease

## 2018-12-03 ENCOUNTER — Ambulatory Visit (HOSPITAL_COMMUNITY)
Admission: RE | Admit: 2018-12-03 | Discharge: 2018-12-03 | Disposition: A | Discharge status: Home / Self Care | Payer: Medicare Other | Source: Ambulatory Visit | Attending: Cardiovascular Disease | Admitting: Cardiovascular Disease

## 2018-12-03 ENCOUNTER — Encounter (HOSPITAL_COMMUNITY): Payer: Self-pay

## 2018-12-03 DIAGNOSIS — R0602 Shortness of breath: Secondary | ICD-10-CM | POA: Diagnosis not present

## 2018-12-03 DIAGNOSIS — I350 Nonrheumatic aortic (valve) stenosis: Secondary | ICD-10-CM | POA: Insufficient documentation

## 2018-12-03 DIAGNOSIS — I5032 Chronic diastolic (congestive) heart failure: Secondary | ICD-10-CM | POA: Insufficient documentation

## 2018-12-03 DIAGNOSIS — Z01818 Encounter for other preprocedural examination: Secondary | ICD-10-CM | POA: Insufficient documentation

## 2018-12-03 DIAGNOSIS — Z87891 Personal history of nicotine dependence: Secondary | ICD-10-CM | POA: Diagnosis not present

## 2018-12-03 DIAGNOSIS — E785 Hyperlipidemia, unspecified: Secondary | ICD-10-CM | POA: Insufficient documentation

## 2018-12-03 DIAGNOSIS — R911 Solitary pulmonary nodule: Secondary | ICD-10-CM | POA: Diagnosis not present

## 2018-12-03 DIAGNOSIS — N183 Chronic kidney disease, stage 3 unspecified: Secondary | ICD-10-CM | POA: Insufficient documentation

## 2018-12-03 DIAGNOSIS — Z79899 Other long term (current) drug therapy: Secondary | ICD-10-CM | POA: Diagnosis not present

## 2018-12-03 DIAGNOSIS — I130 Hypertensive heart and chronic kidney disease with heart failure and stage 1 through stage 4 chronic kidney disease, or unspecified chronic kidney disease: Secondary | ICD-10-CM | POA: Diagnosis not present

## 2018-12-03 DIAGNOSIS — Z20828 Contact with and (suspected) exposure to other viral communicable diseases: Secondary | ICD-10-CM | POA: Diagnosis not present

## 2018-12-03 HISTORY — DX: Personal history of urinary calculi: Z87.442

## 2018-12-03 LAB — CBC
HCT: 38.8 % (ref 36.0–46.0)
Hemoglobin: 12.4 g/dL (ref 12.0–15.0)
MCH: 30 pg (ref 26.0–34.0)
MCHC: 32 g/dL (ref 30.0–36.0)
MCV: 93.7 fL (ref 80.0–100.0)
Platelets: 217 10*3/uL (ref 150–400)
RBC: 4.14 MIL/uL (ref 3.87–5.11)
RDW: 14.2 % (ref 11.5–15.5)
WBC: 7.9 10*3/uL (ref 4.0–10.5)
nRBC: 0 % (ref 0.0–0.2)

## 2018-12-03 LAB — HEMOGLOBIN A1C
Hgb A1c MFr Bld: 5.9 % — ABNORMAL HIGH (ref 4.8–5.6)
Mean Plasma Glucose: 122.63 mg/dL

## 2018-12-03 LAB — COMPREHENSIVE METABOLIC PANEL
ALT: 14 U/L (ref 0–44)
AST: 18 U/L (ref 15–41)
Albumin: 4 g/dL (ref 3.5–5.0)
Alkaline Phosphatase: 42 U/L (ref 38–126)
Anion gap: 8 (ref 5–15)
BUN: 25 mg/dL — ABNORMAL HIGH (ref 8–23)
CO2: 24 mmol/L (ref 22–32)
Calcium: 9 mg/dL (ref 8.9–10.3)
Chloride: 110 mmol/L (ref 98–111)
Creatinine, Ser: 1.28 mg/dL — ABNORMAL HIGH (ref 0.44–1.00)
GFR calc Af Amer: 46 mL/min — ABNORMAL LOW (ref >60)
GFR calc non Af Amer: 40 mL/min — ABNORMAL LOW (ref >60)
Glucose, Bld: 94 mg/dL (ref 70–99)
Potassium: 4.1 mmol/L (ref 3.5–5.1)
Sodium: 142 mmol/L (ref 135–145)
Total Bilirubin: 0.5 mg/dL (ref 0.3–1.2)
Total Protein: 7.1 g/dL (ref 6.5–8.1)

## 2018-12-03 LAB — URINALYSIS, ROUTINE W REFLEX MICROSCOPIC
Bilirubin Urine: NEGATIVE
Glucose, UA: NEGATIVE mg/dL
Hgb urine dipstick: NEGATIVE
Ketones, ur: NEGATIVE mg/dL
Leukocytes,Ua: NEGATIVE
Nitrite: NEGATIVE
Protein, ur: NEGATIVE mg/dL
Specific Gravity, Urine: 1.019 (ref 1.005–1.030)
pH: 5 (ref 5.0–8.0)

## 2018-12-03 LAB — SURGICAL PCR SCREEN
MRSA, PCR: NEGATIVE
Staphylococcus aureus: NEGATIVE

## 2018-12-03 LAB — PROTIME-INR
INR: 1 (ref 0.8–1.2)
Prothrombin Time: 12.8 seconds (ref 11.4–15.2)

## 2018-12-03 LAB — BRAIN NATRIURETIC PEPTIDE: B Natriuretic Peptide: 354.1 pg/mL — ABNORMAL HIGH (ref 0.0–100.0)

## 2018-12-03 LAB — APTT: aPTT: 29 seconds (ref 24–36)

## 2018-12-03 NOTE — Progress Notes (Signed)
PCP - Dr. Woody Seller Cardiologist - Dr. Virgina Jock  Chest x-ray - 12/03/18 EKG - 11/12/18 Stress Test - denies ECHO - 11/23/18 Cardiac Cath - 11/23/18  Sleep Study - denies  Aspirin Instructions: Patient instructed to hold all Aspirin, NSAID's, herbal medications, fish oil and vitamins 7 days prior to surgery.   COVID TEST- 12/03/18. Instructions given to patient to inform security they are having surgery and need to go to the right hand lane. Pt aware if she goes to the "[place]" this is not the correct site, and will need to redirect to the surgical testing site.   Anesthesia review: yes-CAD  Patient denies shortness of breath, fever, cough and chest pain at PAT appointment   All instructions explained to the patient, with a verbal understanding of the material. Patient agrees to go over the instructions while at home for a better understanding. Patient also instructed to self quarantine after being tested for COVID-19. The opportunity to ask questions was provided.

## 2018-12-05 LAB — NOVEL CORONAVIRUS, NAA (HOSP ORDER, SEND-OUT TO REF LAB; TAT 18-24 HRS): SARS-CoV-2, NAA: NOT DETECTED

## 2018-12-06 MED ORDER — SODIUM CHLORIDE 0.9 % IV SOLN
INTRAVENOUS | Status: DC
  Filled 2018-12-06: qty 30

## 2018-12-06 MED ORDER — POTASSIUM CHLORIDE 2 MEQ/ML IV SOLN
80.0000 meq | INTRAVENOUS | Status: DC
  Filled 2018-12-06: qty 40

## 2018-12-06 MED ORDER — DEXMEDETOMIDINE HCL IN NACL 400 MCG/100ML IV SOLN
0.1000 ug/kg/h | INTRAVENOUS | Status: AC
  Administered 2018-12-07: 1 ug/kg/h via INTRAVENOUS
  Filled 2018-12-06 (×2): qty 100

## 2018-12-06 MED ORDER — VANCOMYCIN HCL 10 G IV SOLR
1250.0000 mg | INTRAVENOUS | Status: AC
  Administered 2018-12-07: 1250 mg via INTRAVENOUS
  Filled 2018-12-06: qty 1250

## 2018-12-06 MED ORDER — MAGNESIUM SULFATE 50 % IJ SOLN
40.0000 meq | INTRAMUSCULAR | Status: DC
  Filled 2018-12-06: qty 9.85

## 2018-12-06 MED ORDER — SODIUM CHLORIDE 0.9 % IV SOLN
1.5000 g | INTRAVENOUS | Status: AC
  Administered 2018-12-07: 1.5 g via INTRAVENOUS
  Filled 2018-12-06: qty 1.5

## 2018-12-06 MED ORDER — NOREPINEPHRINE 4 MG/250ML-% IV SOLN
0.0000 ug/min | INTRAVENOUS | Status: AC
  Administered 2018-12-07: 1 ug/min via INTRAVENOUS
  Filled 2018-12-06: qty 250

## 2018-12-06 NOTE — H&P (Signed)
[address]       [place] [address]             [phone]      Cardiothoracic Surgery Admission History and Physical    Referring Provider is Patwardhan, Reynold Bowen, MD  Primary Cardiologist is No primary care provider on file.  PCP is Glenda Chroman, MD   Reason for consultation: critical aortic stenosis.   HPI:   Patient is a 78 year old woman with history of hypertension, dyslipidemia, stage III chronic kidney disease, heavy smoking until recently, and chronic diastolic congestive heart failure who was admitted to the hospital in [place] in August 2020 with progressive shortness of breath. She was treated for pneumonia and improved with diuresis. She was seen by Dr. Virgina Jock last month and had a 2D echocardiogram showing critical aortic stenosis with a mean gradient of 83 mmHg and a peak gradient of 101 mmHg. Aortic valve area was 0.75 cm. Left ventricular ejection fraction was 50 to 55%. There was felt to be at least moderate insufficiency. There is mild mitral regurgitation. She underwent cardiac catheterization on 11/23/2018 which showed mild nonobstructive coronary disease.  She is here today with her daughter-in-law whom she lives with in addition to her son. She has felt better since being placed on Lasix. She continues to have exertional fatigue and mild exertional shortness of breath. She has had no orthopnea. She denies peripheral edema. She has had no chest pain or pressure. She denies any dizziness or syncope.       Past Medical History:  Diagnosis Date   Chronic diastolic (congestive) heart failure (HCC)    CKD (chronic kidney disease), stage III    Diastolic CHF (HCC)    Dyslipidemia    Hypertension    Pulmonary nodule    1.5 x 1.1 cm macrolobulated nodule in the right lower lobe, noted on pre TAVR CT. Will require close follow up    Severe aortic stenosis 11/12/2018   Tobacco abuse         Past Surgical History:    Procedure Laterality Date   CYST EXCISION Left    Left arm cyst   RIGHT HEART CATH AND CORONARY ANGIOGRAPHY N/A 11/23/2018   Procedure: RIGHT HEART CATH AND CORONARY ANGIOGRAPHY; Surgeon: Nigel Mormon, MD; Location: [facility]; Service: Cardiovascular; Laterality: N/A;        Family History  Problem Relation Age of Onset   Heart attack Father    Cancer Sister    Social History        Socioeconomic History   Marital status: Divorced    Spouse name: Not on file   Number of children: 2   Years of education: Not on file   Highest education level: Not on file  Occupational History   Occupation: Retired-[employer] visitor strain: Not on file   Food insecurity    Worry: Not on file    Inability: Not on file   Transportation needs    Medical: Not on file    Non-medical: Not on file  Tobacco Use   Smoking status: Former Smoker    Packs/day: 0.50    Years: 60.00    Pack years: 30.00    Types: Cigarettes    Quit date: 09/2018    Years since quitting: 0.2   Smokeless tobacco: Never Used  Substance and Sexual Activity   Alcohol use: Not Currently   Drug  use: Not on file   Sexual activity: Not on file  Lifestyle   Physical activity    Days per week: Not on file    Minutes per session: Not on file   Stress: Not on file  Relationships   Social connections    Talks on phone: Not on file    Gets together: Not on file    Attends religious service: Not on file    Active member of club or organization: Not on file    Attends meetings of clubs or organizations: Not on file    Relationship status: Not on file   Intimate partner violence    Fear of current or ex partner: Not on file    Emotionally abused: Not on file    Physically abused: Not on file    Forced sexual activity: Not on file  Other Topics Concern   Not on file  Social History Narrative   Not on file         Current Outpatient Medications   Medication Sig Dispense Refill   Calcium Carb-Cholecalciferol (CALCIUM+D3 PO) Take 1 tablet by mouth daily.     furosemide (LASIX) 20 MG tablet Take 1 tablet (20 mg total) by mouth daily. 90 tablet 3   lisinopril (ZESTRIL) 40 MG tablet Take 40 mg by mouth daily.     rosuvastatin (CRESTOR) 20 MG tablet Take 20 mg by mouth every evening.      No current facility-administered medications for this visit.    No Known Allergies   Review of Systems:   General: normal appetite, + decreased energy, no weight gain, no weight loss, no fever  Cardiac: no chest pain with exertion, no chest pain at rest, +SOB with moderate exertion, no resting SOB, no PND, no orthopnea, no palpitations, no arrhythmia, no atrial fibrillation, no LE edema, no dizzy spells, no syncope  Respiratory: + shortness of breath, no home oxygen, no productive cough, no dry cough, no bronchitis, no wheezing, no hemoptysis, no asthma, no pain with inspiration or cough, no sleep apnea, no CPAP at night  GI: no difficulty swallowing, no reflux, no frequent heartburn, no hiatal hernia, no abdominal pain, no constipation, no diarrhea, no hematochezia, no hematemesis, no melena  GU: no dysuria, no frequency, no urinary tract infection, no hematuria, no kidney stones, no kidney disease  Vascular: no pain suggestive of claudication, no pain in feet, no leg cramps, no varicose veins, no DVT, no non-healing foot ulcer  Neuro: no stroke, no TIA's, no seizures, no headaches, no temporary blindness one eye, no slurred speech, no peripheral neuropathy, no chronic pain, no instability of gait, no memory/cognitive dysfunction  Musculoskeletal: no arthritis, no joint swelling, no myalgias, no difficulty walking, normal mobility  Skin: no rash, no itching, no skin infections, no pressure sores or ulcerations  Psych: no anxiety, no depression, no nervousness, no unusual recent stress  Eyes: no blurry vision, no floaters, no recent vision changes, +  wears glasses or contacts  ENT: + hearing loss, no loose or painful teeth, wears dentures, last saw dentist years ago  Hematologic: no easy bruising, no abnormal bleeding, no clotting disorder, no frequent epistaxis  Endocrine: no diabetes, does not check CBG's at home    Physical Exam:    General: Elderly but well-appearing  HEENT: Unremarkable, NCAT, PERLA, EOMI  Neck: no JVD, no bruits, no adenopathy  Chest: clear to auscultation, symmetrical breath sounds, no wheezes, no rhonchi  CV: RRR, grade lll/VI crescendo/decrescendo murmur heard best  at RSB, no diastolic murmur  Abdomen: soft, non-tender, no masses  Extremities: warm, well-perfused, pulses palpable in feet, no LE edema  Rectal/GU Deferred  Neuro: Grossly non-focal and symmetrical throughout  Skin: Clean and dry, no rashes, no breakdown   Diagnostic Tests:   ECHOCARDIOGRAM REPORT  Patient Name: Emily Trujillo Date of Exam: 11/23/2018  Medical Rec #: [id number] Height: 60.0 in  Accession #: [id number] Weight: 170.0 lb  Date of Birth: 1940-04-13 BSA: 1.74 m  Patient Age: 72 years BP: 171/83 mmHg  Patient Gender: F HR: 67 bpm.  Exam Location: Outpatient  Procedure: 2D Echo, Cardiac Doppler and Color Doppler  Indications: I35.0 Nonrheumatic aortic (valve) stenosis  History: Patient has no prior history of Echocardiogram examinations.  Aortic Valve Disease Signs/Symptoms:Dyspnea Risk  Factors:Hypertension. Critical aortic stenosis.  Sonographer: Roseanna Rainbow  Referring Phys: [id number] [facility] J PATWARDHAN  Diagnosing Phys: Vernell Leep MD  IMPRESSIONS  1. Normal size left ventricle with mildly increased twall thickness. LVEF 50-55%. Mild inferior apical hypokineiss. LVEF 50-55%. Grade 1 diastolic dysfunction.  2. Global right ventricle has normal systolic function.The right ventricular size is normal. No increase in right ventricular wall thickness.  3. Severly calcified tricuspid aortic valve. V,max 5.6 m/sec, mean PG 83  mmHg, AVA 0.75 cm2 by continuity equation.  4. Upper limit normal aortic root at 3.8 cm.  5. Mild mitral regurgitation.  6. No TR jet to evaluate pulmonary pressure.  FINDINGS  Left Ventricle: Left ventricular ejection fraction, by visual estimation, is 50 to 55%. The left ventricle has normal function. There is mildly increased left ventricular hypertrophy. Spectral Doppler shows Left ventricular diastolic Doppler parameters  are consistent with impaired relaxation pattern of LV diastolic filling. Mild apical inferior hypokinesis.  Right Ventricle: The right ventricular size is normal. No increase in right ventricular wall thickness. Global RV systolic function is has normal systolic function.  Left Atrium: Left atrial size was normal in size.  Right Atrium: Right atrial size was normal in size  Pericardium: There is no evidence of pericardial effusion.  Mitral Valve: The mitral valve is grossly normal. Mild mitral valve regurgitation.  Tricuspid Valve: The tricuspid valve is normal in structure. Tricuspid valve regurgitation was not visualized by color flow Doppler.  Aortic Valve: The aortic valve is tricuspid. Aortic valve regurgitation is severe by color flow Doppler. Aortic valve mean gradient measures 64.7 mmHg. Aortic valve peak gradient measures 101.3 mmHg. Aortic valve area, by VTI measures 0.86 cm.  Pulmonic Valve: The pulmonic valve was grossly normal. Pulmonic valve regurgitation is not visualized by color flow Doppler.  Aorta: Upper limit normal aortic root at 3.8 cm.  IAS/Shunts: The interatrial septum was not assessed.  LEFT VENTRICLE  PLAX 2D  LVIDd: 4.90 cm Diastology  LVIDs: 3.60 cm LV e' lateral: 6.16 cm/s  LV PW: 1.40 cm LV E/e' lateral: 11.6  LV IVS: 1.00 cm LV e' medial: 3.32 cm/s  LVOT diam: 2.10 cm LV E/e' medial: 21.6  LV SV: 58 ml  LV SV Index: 31.70 2D Longitudinal Strain  LVOT Area: 3.46 cm 2D Strain GLS (A2C): 20.2 %  2D Strain GLS (A3C): 11.0 %  2D Strain  GLS (A4C): 21.4 %  LV Volumes (MOD) 2D Strain GLS Avg: 17.5 %  LV area d, A4C: 26.70 cm  LV area s, A2C: 17.60 cm  LV area s, A4C: 16.30 cm  LV major d, A4C: 7.55 cm  LV major s, A2C: 6.47 cm  LV major s, A4C: 6.41 cm  LV vol d, MOD A4C: 76.8 ml  LV vol s, MOD A2C: 41.8 ml  LV vol s, MOD A4C: 34.3 ml  LV SV MOD A4C: 76.8 ml  RIGHT VENTRICLE IVC  RV S prime: 12.60 cm/s IVC diam: 1.70 cm  TAPSE (M-mode): 2.4 cm  LEFT ATRIUM Index RIGHT ATRIUM Index  LA diam: 3.70 cm 2.12 cm/m RA Area: 14.10 cm  LA Vol (A2C): 47.5 ml 27.27 ml/m RA Volume: 38.50 ml 22.10 ml/m  LA Vol (A4C): 40.5 ml 23.25 ml/m  LA Biplane Vol: 43.7 ml 25.09 ml/m  AORTIC VALVE  AV Area (Vmax): 0.84 cm  AV Area (Vmean): 0.78 cm  AV Area (VTI): 0.86 cm  AV Vmax: 503.33 cm/s  AV Vmean: 384.667 cm/s  AV VTI: 1.293 m  AV Peak Grad: 101.3 mmHg  AV Mean Grad: 64.7 mmHg  LVOT Vmax: 122.00 cm/s  LVOT Vmean: 86.900 cm/s  LVOT VTI: 0.322 m  LVOT/AV VTI ratio: 0.25  AORTA  Ao Root diam: 3.20 cm  Ao Asc diam: 3.80 cm  MITRAL VALVE  MV Area (PHT): 1.61 cm SHUNTS  MV PHT: 136.88 msec Systemic VTI: 0.32 m  MV Decel Time: 472 msec Systemic Diam: 2.10 cm  MV E velocity: 71.60 cm/s 103 cm/s  MV A velocity: 105.00 cm/s 70.3 cm/s  MV E/A ratio: 0.68 1.5  Manish Patwardhan MD  Electronically signed by Vernell Leep MD  Signature Date/Time: 11/23/2018/11:35:00 AM     Panel Physicians Referring Physician Case Authorizing Physician  Patwardhan, Reynold Bowen, MD (Primary)    Procedures  RIGHT HEART CATH AND CORONARY ANGIOGRAPHY  Conclusion  LM: Large vessel. Mid focal 30% stenosis  LAD: Mild luminal irregularities. Ostial Diag1 20% stenosis.  LCXL: No significant abnormalities.  RCA: Large vessel. Prox 40%, mid 20%, and distal 20% stenoses.  RA: 2 mmHg  RV: 46/0 mmHg  PA: 53/17 mmHg, mean PAP 32 mmHg  PCWP: 16 mmHg  CO: 4.0 L/min  CI: 2.2 L/min/m2  Recommendation: TAVR consult  Medical management for  CAD.   Recommendations  Antiplatelet/Anticoag Recommend Aspirin 81mg  daily for moderate CAD. TAVR consult  Indications  Severe aortic stenosis [I35.0 (ICD-10-CM)]  Procedural Details  Technical Details Procedures: 1. Ultrasound guided right radial artery access 2. Right heart catheterization 3. Selective left and right coronary angiography 4. Conscious sedation monitoring 61 min  Indication: Critical aortic stenosis  History: 78 y.o. Caucasian female with hypertension, hyperlipidemia, stage D critical aortic stenosis.  Right radial artery visualized under ultrasound guidance. Vessel and flow identified. Image placed on the chart. Successful ultrasound guided right radial artery access.   Diagnostic Angiography: Catheter/s advances over guidewire under fluoroscopy Left coronary artery: 5 Fr JL 3.5  Right coronary artery: Multiple catheters uses. 5 Fr AR-1 Extremely tortuous radial, brachial artery, and aorta. Difficult procedure due to tortuosity.    Pressures tracings obtained in right atrium, right ventricle, pulmonary artery, and pulmonary capillary wedge position.   Anticoagulation:  4000 units heparin Hemostasis: TR band  Total contrast used: 80 cc   Total fluoro time: 19.0 min Air Kerma: 261 mGy  All wires and catheters removed out of the body at the end of the procedure Final angiogram showed no dissection/perforation         Estimated blood loss <50 mL.   During this procedure medications were administered to achieve and maintain moderate conscious sedation while the patient's heart rate, blood pressure, and oxygen saturation were continuously monitored and I was present face-to-face 100% of this time.  Medications  (Filter: Administrations occurring from 11/23/18 1539 to 11/23/18 1709)          Medication Rate/Dose/Volume Action  Date Time   lidocaine (PF) (XYLOCAINE) 1 % injection (mL) 2 mL Given 11/23/18 1550   Total dose as of 11/23/18 1709 2 mL  Given 1555   4 mL        Radial Cocktail/Verapamil only (mL) 10 mL Given 11/23/18 1557   Total dose as of 11/23/18 1709        10 mL        fentaNYL (SUBLIMAZE) injection (mcg) 12.5 mcg Given 11/23/18 1558   Total dose as of 11/23/18 1709 12.5 mcg Given 1630   25 mcg        midazolam (VERSED) injection (mg) 0.5 mg Given 11/23/18 1559   Total dose as of 11/23/18 1709 0.5 mg Given 1630   1 mg        Heparin (Porcine) in NaCl 1000-0.9 UT/500ML-% SOLN (mL) 500 mL Given 11/23/18 1612   Total dose as of 11/23/18 1709 500 mL Given 1612   1,000 mL        nitroGLYCERIN 1 mg/10 mL (100 mcg/mL) - IR/CATH LAB (mcg) 100 mcg Given 11/23/18 1641   Total dose as of 11/23/18 1709        100 mcg        heparin injection (Units) 4,000 Units Given 11/23/18 1701   Total dose as of 11/23/18 1709        4,000 Units        iohexol (OMNIPAQUE) 350 MG/ML injection (mL) 80 mL Given 11/23/18 1705   Total dose as of 11/23/18 1709        80 mL        Sedation Time  Sedation Time Physician-1: 1 hour 1 minute 11 seconds  Contrast  Medication Name Total Dose  iohexol (OMNIPAQUE) 350 MG/ML injection 80 mL  Radiation/Fluoro  Fluoro time: 19 (min)  DAP: [id number] (mGycm2)  Cumulative Air Kerma: 0000000 (mGy)  Complications  Complications documented before study signed (11/23/2018 5:28 PM)   RIGHT HEART CATH AND CORONARY ANGIOGRAPHY   None Documented by Nigel Mormon, MD 11/23/2018 5:20 PM  Date Found: 11/23/2018  Time Range: Intraprocedure    Coronary Findings  Diagnostic  Dominance: Right  Left Main  Mid LM lesion 30% stenosed  Mid LM lesion is 30% stenosed. The lesion is focal.  Left Anterior Descending  First Diagonal Branch  1st Diag lesion 20% stenosed  1st Diag lesion is 20% stenosed.  Left Circumflex  Vessel is normal in caliber. Vessel is angiographically normal.  Right Coronary Artery  Prox RCA lesion 40% stenosed  Prox RCA lesion is 40% stenosed.  Mid RCA-1 lesion 20% stenosed  Mid RCA-1  lesion is 20% stenosed.  Mid RCA-2 lesion 20% stenosed  Mid RCA-2 lesion is 20% stenosed.  Dist RCA lesion 20% stenosed  Dist RCA lesion is 20% stenosed.  Intervention  No interventions have been documented.  Right Heart  Right Heart Pressures RA: 2 mmHg RV: 46/0 mmHg PA: 53/17 mmHg, mean PAP 32 mmHg PCWP: 16 mmHg  CO: 4.0 L/min CI: 2.2 L/min/m2  Left Heart  Left Ventricle LV not entered  Coronary Diagrams  Diagnostic  Dominance: Right   Intervention  Implants     No implant documentation for this case.  Syngo Images  Link to Procedure Log   Show images for CARDIAC CATHETERIZATION Procedure Log  Images on [employer] Storage  Show images for Emily Trujillo, Emily Trujillo   Hemo Data   Most Recent Value  Fick Cardiac Output 4.08 L/min  Fick Cardiac Output Index 2.29 (L/min)/BSA  RA A Wave 8 mmHg  RA V Wave 3 mmHg  RA Mean 2 mmHg  RV Systolic Pressure 46 mmHg  RV Diastolic Pressure 0 mmHg  RV EDP 5 mmHg  PA Systolic Pressure 53 mmHg  PA Diastolic Pressure 17 mmHg  PA Mean 32 mmHg  PW A Wave 25 mmHg  PW V Wave 22 mmHg  PW Mean 16 mmHg  AO Systolic Pressure 123456 mmHg  AO Diastolic Pressure 61 mmHg  AO Mean 91 mmHg  QP/QS 1  TPVR Index 13.98 HRUI  TSVR Index 39.76 HRUI  PVR SVR Ratio 0.18  TPVR/TSVR Ratio 0.35     ADDENDUM REPORT: 11/30/2018 09:59  CLINICAL DATA: 78 year old female with severe aortic stenosis being  evaluated for a TAVR procedure.  EXAM:  Cardiac TAVR CT  TECHNIQUE:  The patient was scanned on a [employer]. A 120 kV  retrospective scan was triggered in the descending thoracic aorta at  111 HU's. Gantry rotation speed was 250 msecs and collimation was .6  mm. No beta blockade or nitro were given. The 3D data set was  reconstructed in 5% intervals of the R-R cycle. Systolic and  diastolic phases were analyzed on a dedicated work station using  MPR, MIP and VRT modes. The patient received 80 cc of contrast.  FINDINGS:  Aortic Valve:  Trileaflet aortic valve with severe asymmetric  calcifications of the non-coronary leaflet but no calcifications  extending into the LVOT.  Aorta: Ascending aorta is ectatic with maximum diameter 40 mm. There  are mild diffuse calcifications and atherosclerotic plaque in the  ascending aorta and arch and moderate to severe protruding plaque in  the descending aorta. No dissection.  Sinotubular Junction: 30 x 30 mm  Ascending Thoracic Aorta: 40 x 40 mm  Aortic Arch: 26 x 24 mm  Descending Thoracic Aorta: 26 x 26 mm  Sinus of Valsalva Measurements:  Non-coronary: 32 mm  Right -coronary: 30 mm  Left -coronary: 31 mm  Coronary Artery Height above Annulus:  Left Main: 8.8 mm  Right Coronary: 14 mm  Virtual Basal Annulus Measurements:  Maximum/Minimum Diameter: 25.4 x 19.7 mm  Mean Diameter: 22.3 mm  Perimeter: 72.1 mm  Area: 391 mm2  Optimum Fluoroscopic Angle for Delivery: LAO 9 CAU 6  IMPRESSION:  1. Trileaflet aortic valve with severe asymmetric calcifications of  the non-coronary leaflet and calcifications extending into the LVOT.  Aortic valve calcium score 4981. Annular measurements suitable for  delivery of a 23 mm Edwards-SAPIEN 3 valve.  2. Sufficient RCA to annulus distance. Borderline LM to annulus  distance measuring 9 mm.  3. Optimum Fluoroscopic Angle for Delivery: LAO 9 CAU 6  4. No thrombus in the left atrial appendage.  5. Ascending aorta is ectatic with maximum diameter 40 mm.  Electronically Signed  By: Ena Dawley  On: 11/30/2018 09:59   Addended by Dorothy Spark, MD on 11/30/2018 10:01 AM  Study Result   EXAM:  OVER-READ INTERPRETATION CT CHEST  The following report is an over-read performed by radiologist Dr.  Vinnie Langton of [facility], [place] on 11/29/2018. This  over-read does not include interpretation of cardiac or coronary  anatomy or pathology. The coronary calcium score/coronary CTA  interpretation by the cardiologist is  attached.  COMPARISON: None.  FINDINGS:  Extracardiac findings will be described separately  under dictation  for contemporaneously obtained CTA chest, abdomen and pelvis.  IMPRESSION:  Please see separate dictation for contemporaneously obtained CTA  chest, abdomen and pelvis dated 11/29/2018 for full description of  relevant extracardiac findings.  Electronically Signed:  By: Vinnie Langton M.D.  On: 11/29/2018 11:49   CLINICAL DATA: 78 year old female with history of severe aortic  valve stenosis. Preprocedural study prior to potential transcatheter  aortic valve replacement (TAVR) procedure.  EXAM:  CT ANGIOGRAPHY CHEST, ABDOMEN AND PELVIS  TECHNIQUE:  Multidetector CT imaging through the chest, abdomen and pelvis was  performed using the standard protocol during bolus administration of  intravenous contrast. Multiplanar reconstructed images and MIPs were  obtained and reviewed to evaluate the vascular anatomy.  CONTRAST: 141mL OMNIPAQUE IOHEXOL 350 MG/ML SOLN  COMPARISON: None.  FINDINGS:  CTA CHEST FINDINGS  Cardiovascular: Heart size is mildly enlarged. There is no  significant pericardial fluid, thickening or pericardial  calcification. There is aortic atherosclerosis, as well as  atherosclerosis of the great vessels of the mediastinum and the  coronary arteries, including calcified atherosclerotic plaque in the  left anterior descending, left circumflex and right coronary  arteries.  Mediastinum/Lymph Nodes: No pathologically enlarged mediastinal or  hilar lymph nodes. Esophagus is unremarkable in appearance. No  axillary lymphadenopathy.  Lungs/Pleura: 1.5 x 1.1 cm macrolobulated nodule in the right lower  lobe (axial image 72 of series 5). No acute consolidative airspace  disease. No pleural effusions.  Musculoskeletal/Soft Tissues: There are no aggressive appearing  lytic or blastic lesions noted in the visualized portions of the  skeleton.  CTA ABDOMEN AND  PELVIS FINDINGS  Hepatobiliary: No suspicious cystic or solid hepatic lesions. No  intra or extrahepatic biliary ductal dilatation. Gallbladder is  normal in appearance.  Pancreas: No pancreatic mass. No pancreatic ductal dilatation. No  pancreatic or peripancreatic fluid collections or inflammatory  changes.  Spleen: Unremarkable.  Adrenals/Urinary Tract: 8 mm nonobstructive calculus in the lower  pole collecting system of the right kidney. Multiple low-attenuation  lesions in both kidneys, compatible with simple cysts, largest of  which is a 3.7 cm exophytic lesion in the lower pole of the right  kidney. Other subcentimeter low-attenuation lesions in both kidneys  are too small to definitively characterize, but are favored to  represent tiny cysts. No hydroureteronephrosis. Urinary bladder is  normal in appearance.  Stomach/Bowel: Normal appearance of the stomach. No pathologic  dilatation of small bowel or colon. Numerous colonic diverticulae  are noted, particularly in the descending colon and sigmoid colon,  without surrounding inflammatory changes to suggest an acute  diverticulitis at this time. The appendix is not confidently  identified and may be surgically absent. Regardless, there are no  inflammatory changes noted adjacent to the cecum to suggest the  presence of an acute appendicitis at this time.  Vascular/Lymphatic: Aortic atherosclerosis, without evidence of  aneurysm or dissection in the abdominal or pelvic vasculature.  Vascular findings and measurements pertinent to potential TAVR  procedure, as detailed below. No lymphadenopathy noted in the  abdomen or pelvis.  Reproductive: Uterus and ovaries are trophic.  Other: No significant volume of ascites. No pneumoperitoneum.  Musculoskeletal: There are no aggressive appearing lytic or blastic  lesions noted in the visualized portions of the skeleton.  VASCULAR MEASUREMENTS PERTINENT TO TAVR:  AORTA:  Minimal Aortic  Diameter-14 x 12 mm  Severity of Aortic Calcification-moderate to severe  RIGHT PELVIS:  Right Common Iliac Artery -  Minimal Diameter-10.4 x 8.4 mm  Tortuosity -  mild  Calcification-moderate to severe  Right External Iliac Artery -  Minimal Diameter-6.6 x 7.3 mm  Tortuosity - mild-to-moderate  Calcification-minimal  Right Common Femoral Artery -  Minimal Diameter-5.7 x 6.6 mm  Tortuosity - mild  Calcification-mild  LEFT PELVIS:  Left Common Iliac Artery -  Minimal Diameter-7.3 x 7.1 mm  Tortuosity - mild  Calcification-moderate to severe  Left External Iliac Artery -  Minimal Diameter-6.8 x 4.6 mm  Tortuosity - mild  Calcification-minimal  Left Common Femoral Artery -  Minimal Diameter-6.5 x 6.0 mm  Tortuosity - mild  Calcification-mild  Review of the MIP images confirms the above findings.  IMPRESSION:  1. Vascular findings and measurements pertinent to potential TAVR  procedure, as detailed above.  2. Severe thickening and calcification of the aortic valve,  compatible with reported clinical history of severe aortic stenosis.  3. Aortic atherosclerosis, in addition to 3 vessel coronary artery  disease.  4. 1.5 x 1.1 cm macrolobulated nodule in the right lower lobe. This  is nonspecific, but the possibility of a primary bronchogenic  neoplasm or solitary metastasis should be considered. Consider one  of the following in 3 months for both low-risk and high-risk  individuals: (a) repeat chest CT or (b) follow-up PET-CT. This  recommendation follows the consensus statement: Guidelines for  Management of Incidental Pulmonary Nodules Detected on CT Images:  From the Fleischner Society 2017; Radiology 2017; 284:228-243.  5. 8 mm nonobstructive calculus in the lower pole collecting system  of the right kidney.  6. Colonic diverticulosis without evidence of acute diverticulitis  at this time.  7. Additional incidental findings, as above.  Electronically Signed  By: Vinnie Langton M.D.  On: 11/29/2018 13:54    STS Risk Score:  AVR  Risk of Mortality:  2.575%  Renal Failure:  2.738%  Permanent Stroke:  1.197%  Prolonged Ventilation:  10.900%  DSW Infection:  0.089%  Reoperation:  3.296%  Morbidity or Mortality:  14.647%  Short Length of Stay:  31.935%  Long Length of Stay:  6.462%   Impression:   This 78 year old woman has stage D, severe, symptomatic aortic stenosis with New York Heart Association class III symptoms of exertional fatigue and shortness of breath consistent with chronic diastolic congestive heart failure. I have personally reviewed her 2D echocardiogram, cardiac catheterization, and CTA studies. Echocardiogram shows a trileaflet aortic valve with severe calcification particularly of the noncoronary leaflet with restricted mobility. The mean gradient is 83 mmHg consistent with critical aortic stenosis. Left ventricular systolic function is normal. Cardiac catheterization showed mild nonobstructive coronary disease. I agree that aortic valve replacement is indicated in this patient with critical aortic stenosis. Given her age transcatheter aortic valve replacement would be the best treatment for her. Her gated cardiac CTA shows anatomy suitable for transcatheter aortic valve replacement using a Sapien 3 valve. Left main high was only measured at 8.8 mm but her sinus dimension is 31 mm and there is minimal calcification in the left coronary leaflet. Her abdominal and pelvic CTA shows adequate pelvic vascular anatomy to allow transfemoral insertion. A significant incidental finding on her CT scan of the chest is that she has a 1.5 cm x 1.1 cm macrolobulated nodule in the right lower lobe of the lung that is nonspecific but primary bronchogenic carcinoma will have to be ruled out. I think it would be best to proceed with transcatheter aortic valve replacement given the critical nature of her aortic stenosis with follow-up of the lung nodule with  a  PET CT scan in 3 months. I reviewed the CT images with the patient and her daughter-in-law and answered all their questions concerning this.  The patient and her daughter-in-law were counseled at length regarding treatment alternatives for management of severe symptomatic aortic stenosis. The risks and benefits of surgical intervention has been discussed in detail. Long-term prognosis with medical therapy was discussed. Alternative approaches such as conventional surgical aortic valve replacement, transcatheter aortic valve replacement, and palliative medical therapy were compared and contrasted at length. This discussion was placed in the context of the patient's own specific clinical presentation and past medical history. All of their questions have been addressed.  Following the decision to proceed with transcatheter aortic valve replacement, a discussion was held regarding what types of management strategies would be attempted intraoperatively in the event of life-threatening complications, including whether or not the patient would be considered a candidate for the use of cardiopulmonary bypass and/or conversion to open sternotomy for attempted surgical intervention. I think she is a candidate for emergent sternotomy intraoperative complications.  The patient has been advised of a variety of complications that might develop including but not limited to risks of death, stroke, paravalvular leak, aortic dissection or other major vascular complications, aortic annulus rupture, device embolization, cardiac rupture or perforation, mitral regurgitation, acute myocardial infarction, arrhythmia, heart block or bradycardia requiring permanent pacemaker placement, congestive heart failure, respiratory failure, renal failure, pneumonia, infection, other late complications related to structural valve deterioration or migration, or other complications that might ultimately cause a temporary or permanent loss of  functional independence or other long term morbidity. The patient provides full informed consent for the procedure as described and all questions were answered.    Plan:   Transfemoral transcatheter aortic valve replacement.   The right lower lobe lung nodule will be worked up with a PET CT scan in 3 months.    [name], MD

## 2018-12-07 ENCOUNTER — Other Ambulatory Visit: Payer: Self-pay

## 2018-12-07 ENCOUNTER — Inpatient Hospital Stay (HOSPITAL_COMMUNITY): Payer: Medicare Other | Admitting: Certified Registered"

## 2018-12-07 ENCOUNTER — Inpatient Hospital Stay (HOSPITAL_COMMUNITY)
Admission: RE | Admit: 2018-12-07 | Discharge: 2018-12-08 | DRG: 266 | Disposition: A | Discharge status: Home / Self Care | Payer: Medicare Other | Attending: Surgery | Admitting: Surgery

## 2018-12-07 ENCOUNTER — Inpatient Hospital Stay (HOSPITAL_COMMUNITY): Payer: Medicare Other

## 2018-12-07 ENCOUNTER — Inpatient Hospital Stay (HOSPITAL_COMMUNITY): Payer: Medicare Other | Admitting: Vascular Surgery

## 2018-12-07 ENCOUNTER — Encounter (HOSPITAL_COMMUNITY)
Admission: RE | Disposition: A | Discharge status: Home / Self Care | Payer: Self-pay | Source: Home / Self Care | Attending: Surgery

## 2018-12-07 ENCOUNTER — Encounter (HOSPITAL_COMMUNITY): Payer: Self-pay

## 2018-12-07 DIAGNOSIS — Z952 Presence of prosthetic heart valve: Secondary | ICD-10-CM | POA: Diagnosis not present

## 2018-12-07 DIAGNOSIS — N183 Chronic kidney disease, stage 3 unspecified: Secondary | ICD-10-CM | POA: Diagnosis present

## 2018-12-07 DIAGNOSIS — I130 Hypertensive heart and chronic kidney disease with heart failure and stage 1 through stage 4 chronic kidney disease, or unspecified chronic kidney disease: Secondary | ICD-10-CM | POA: Diagnosis present

## 2018-12-07 DIAGNOSIS — R911 Solitary pulmonary nodule: Secondary | ICD-10-CM | POA: Diagnosis present

## 2018-12-07 DIAGNOSIS — Z006 Encounter for examination for normal comparison and control in clinical research program: Secondary | ICD-10-CM | POA: Diagnosis not present

## 2018-12-07 DIAGNOSIS — I5033 Acute on chronic diastolic (congestive) heart failure: Secondary | ICD-10-CM | POA: Diagnosis present

## 2018-12-07 DIAGNOSIS — Z8249 Family history of ischemic heart disease and other diseases of the circulatory system: Secondary | ICD-10-CM | POA: Diagnosis not present

## 2018-12-07 DIAGNOSIS — I350 Nonrheumatic aortic (valve) stenosis: Secondary | ICD-10-CM | POA: Diagnosis not present

## 2018-12-07 DIAGNOSIS — J811 Chronic pulmonary edema: Secondary | ICD-10-CM | POA: Diagnosis not present

## 2018-12-07 DIAGNOSIS — Z87891 Personal history of nicotine dependence: Secondary | ICD-10-CM | POA: Diagnosis not present

## 2018-12-07 DIAGNOSIS — E785 Hyperlipidemia, unspecified: Secondary | ICD-10-CM | POA: Diagnosis present

## 2018-12-07 DIAGNOSIS — Z954 Presence of other heart-valve replacement: Secondary | ICD-10-CM | POA: Diagnosis not present

## 2018-12-07 DIAGNOSIS — I10 Essential (primary) hypertension: Secondary | ICD-10-CM | POA: Diagnosis present

## 2018-12-07 DIAGNOSIS — I2510 Atherosclerotic heart disease of native coronary artery without angina pectoris: Secondary | ICD-10-CM | POA: Diagnosis present

## 2018-12-07 DIAGNOSIS — I5032 Chronic diastolic (congestive) heart failure: Secondary | ICD-10-CM | POA: Diagnosis not present

## 2018-12-07 DIAGNOSIS — Z809 Family history of malignant neoplasm, unspecified: Secondary | ICD-10-CM

## 2018-12-07 DIAGNOSIS — Z720 Tobacco use: Secondary | ICD-10-CM | POA: Diagnosis present

## 2018-12-07 HISTORY — DX: Presence of prosthetic heart valve: Z95.2

## 2018-12-07 HISTORY — PX: TRANSCATHETER AORTIC VALVE REPLACEMENT, TRANSFEMORAL: SHX6400

## 2018-12-07 HISTORY — PX: TEE WITHOUT CARDIOVERSION: SHX5443

## 2018-12-07 HISTORY — DX: Chronic diastolic (congestive) heart failure: I50.32

## 2018-12-07 LAB — POCT I-STAT, CHEM 8
BUN: 23 mg/dL (ref 8–23)
BUN: 24 mg/dL — ABNORMAL HIGH (ref 8–23)
BUN: 23 mg/dL (ref 8–23)
Calcium, Ion: 1.25 mmol/L (ref 1.15–1.40)
Calcium, Ion: 1.25 mmol/L (ref 1.15–1.40)
Calcium, Ion: 1.23 mmol/L (ref 1.15–1.40)
Chloride: 108 mmol/L (ref 98–111)
Chloride: 108 mmol/L (ref 98–111)
Chloride: 109 mmol/L (ref 98–111)
Creatinine, Ser: 1 mg/dL (ref 0.44–1.00)
Creatinine, Ser: 1.1 mg/dL — ABNORMAL HIGH (ref 0.44–1.00)
Creatinine, Ser: 1.1 mg/dL — ABNORMAL HIGH (ref 0.44–1.00)
Glucose, Bld: 127 mg/dL — ABNORMAL HIGH (ref 70–99)
Glucose, Bld: 132 mg/dL — ABNORMAL HIGH (ref 70–99)
Glucose, Bld: 130 mg/dL — ABNORMAL HIGH (ref 70–99)
HCT: 31 % — ABNORMAL LOW (ref 36.0–46.0)
HCT: 31 % — ABNORMAL LOW (ref 36.0–46.0)
HCT: 31 % — ABNORMAL LOW (ref 36.0–46.0)
Hemoglobin: 10.5 g/dL — ABNORMAL LOW (ref 12.0–15.0)
Hemoglobin: 10.5 g/dL — ABNORMAL LOW (ref 12.0–15.0)
Hemoglobin: 10.5 g/dL — ABNORMAL LOW (ref 12.0–15.0)
Potassium: 4.3 mmol/L (ref 3.5–5.1)
Potassium: 4.7 mmol/L (ref 3.5–5.1)
Potassium: 4.7 mmol/L (ref 3.5–5.1)
Sodium: 143 mmol/L (ref 135–145)
Sodium: 144 mmol/L (ref 135–145)
Sodium: 143 mmol/L (ref 135–145)
TCO2: 24 mmol/L (ref 22–32)
TCO2: 25 mmol/L (ref 22–32)
TCO2: 24 mmol/L (ref 22–32)

## 2018-12-07 LAB — BLOOD GAS, ARTERIAL
Acid-base deficit: 1.1 mmol/L (ref 0.0–2.0)
Bicarbonate: 22.8 mmol/L (ref 20.0–28.0)
Drawn by: 297540
FIO2: 21
O2 Saturation: 97.9 %
Patient temperature: 98.6
pCO2 arterial: 36.1 mmHg (ref 32.0–48.0)
pH, Arterial: 7.417 (ref 7.350–7.450)
pO2, Arterial: 99.1 mmHg (ref 83.0–108.0)

## 2018-12-07 LAB — TYPE AND SCREEN
ABO/RH(D): A NEG
Antibody Screen: POSITIVE

## 2018-12-07 SURGERY — IMPLANTATION, AORTIC VALVE, TRANSCATHETER, FEMORAL APPROACH
Anesthesia: Monitor Anesthesia Care | Site: Chest

## 2018-12-07 MED ORDER — ONDANSETRON HCL 4 MG/2ML IJ SOLN: 4.0000 mg | Freq: Once | INTRAMUSCULAR | Status: DC | PRN

## 2018-12-07 MED ORDER — SODIUM CHLORIDE 0.9 % IV SOLN
1.5000 g | Freq: Two times a day (BID) | INTRAVENOUS | Status: DC
  Administered 2018-12-07 – 2018-12-08 (×2): 1.5 g via INTRAVENOUS
  Filled 2018-12-07 (×3): qty 1.5

## 2018-12-07 MED ORDER — PROPOFOL 10 MG/ML IV BOLUS
INTRAVENOUS | Status: DC | PRN
  Administered 2018-12-07: 20 mg via INTRAVENOUS
  Administered 2018-12-07: 30 mg via INTRAVENOUS

## 2018-12-07 MED ORDER — PHENYLEPHRINE HCL-NACL 20-0.9 MG/250ML-% IV SOLN
0.0000 ug/min | INTRAVENOUS | Status: DC
  Filled 2018-12-07: qty 250

## 2018-12-07 MED ORDER — ACETAMINOPHEN 650 MG RE SUPP: 650.0000 mg | Freq: Four times a day (QID) | RECTAL | Status: DC | PRN

## 2018-12-07 MED ORDER — PHENYLEPHRINE 40 MCG/ML (10ML) SYRINGE FOR IV PUSH (FOR BLOOD PRESSURE SUPPORT)
PREFILLED_SYRINGE | INTRAVENOUS | Status: AC
  Filled 2018-12-07: qty 10

## 2018-12-07 MED ORDER — ASPIRIN 81 MG PO CHEW
81.0000 mg | CHEWABLE_TABLET | Freq: Every day | ORAL | Status: DC
  Administered 2018-12-08: 81 mg via ORAL
  Filled 2018-12-07: qty 1

## 2018-12-07 MED ORDER — PROPOFOL 500 MG/50ML IV EMUL
INTRAVENOUS | Status: DC | PRN
  Administered 2018-12-07: 25 ug/kg/min via INTRAVENOUS

## 2018-12-07 MED ORDER — ONDANSETRON HCL 4 MG/2ML IJ SOLN: 4.0000 mg | Freq: Four times a day (QID) | INTRAMUSCULAR | Status: DC | PRN

## 2018-12-07 MED ORDER — SODIUM CHLORIDE 0.9 % IV SOLN
INTRAVENOUS | Status: AC
  Administered 2018-12-07: 17:00:00 via INTRAVENOUS

## 2018-12-07 MED ORDER — IODIXANOL 320 MG/ML IV SOLN
INTRAVENOUS | Status: DC | PRN
  Administered 2018-12-07: 12:00:00 101 mL

## 2018-12-07 MED ORDER — SODIUM CHLORIDE 0.9 % IV SOLN
INTRAVENOUS | Status: DC
  Administered 2018-12-07: 12:00:00 via INTRAVENOUS

## 2018-12-07 MED ORDER — HEPARIN SODIUM (PORCINE) 1000 UNIT/ML IJ SOLN
INTRAMUSCULAR | Status: AC
  Filled 2018-12-07: qty 1

## 2018-12-07 MED ORDER — PHENYLEPHRINE 40 MCG/ML (10ML) SYRINGE FOR IV PUSH (FOR BLOOD PRESSURE SUPPORT): PREFILLED_SYRINGE | INTRAVENOUS | Status: DC | PRN

## 2018-12-07 MED ORDER — SODIUM CHLORIDE 0.9% FLUSH
3.0000 mL | Freq: Two times a day (BID) | INTRAVENOUS | Status: DC
  Administered 2018-12-07 – 2018-12-08 (×2): 3 mL via INTRAVENOUS

## 2018-12-07 MED ORDER — ONDANSETRON HCL 4 MG/2ML IJ SOLN
INTRAMUSCULAR | Status: AC
  Filled 2018-12-07: qty 2

## 2018-12-07 MED ORDER — SODIUM CHLORIDE 0.9% FLUSH: 3.0000 mL | INTRAVENOUS | Status: DC | PRN

## 2018-12-07 MED ORDER — ONDANSETRON HCL 4 MG/2ML IJ SOLN
INTRAMUSCULAR | Status: DC | PRN
  Administered 2018-12-07: 4 mg via INTRAVENOUS

## 2018-12-07 MED ORDER — FENTANYL CITRATE (PF) 100 MCG/2ML IJ SOLN: 25.0000 ug | INTRAMUSCULAR | Status: DC | PRN

## 2018-12-07 MED ORDER — SODIUM CHLORIDE 0.9 % IV SOLN
INTRAVENOUS | Status: DC | PRN
  Administered 2018-12-07 (×3): 500 mL

## 2018-12-07 MED ORDER — CHLORHEXIDINE GLUCONATE 0.12 % MT SOLN
15.0000 mL | Freq: Once | OROMUCOSAL | Status: AC
  Administered 2018-12-07: 10:00:00 15 mL via OROMUCOSAL
  Filled 2018-12-07: qty 15

## 2018-12-07 MED ORDER — ROSUVASTATIN CALCIUM 20 MG PO TABS
20.0000 mg | ORAL_TABLET | Freq: Every evening | ORAL | Status: DC
  Administered 2018-12-07: 20 mg via ORAL
  Filled 2018-12-07: qty 1

## 2018-12-07 MED ORDER — TRAMADOL HCL 50 MG PO TABS: 50.0000 mg | ORAL_TABLET | ORAL | Status: DC | PRN

## 2018-12-07 MED ORDER — DEXMEDETOMIDINE HCL IN NACL 200 MCG/50ML IV SOLN
INTRAVENOUS | Status: DC | PRN
  Administered 2018-12-07: 79.6 ug via INTRAVENOUS

## 2018-12-07 MED ORDER — SODIUM CHLORIDE 0.9 % IV SOLN
INTRAVENOUS | Status: AC
  Filled 2018-12-07 (×3): qty 1.2

## 2018-12-07 MED ORDER — CHLORHEXIDINE GLUCONATE 4 % EX LIQD
60.0000 mL | Freq: Once | CUTANEOUS | Status: DC
  Filled 2018-12-07: qty 60

## 2018-12-07 MED ORDER — PROTAMINE SULFATE 10 MG/ML IV SOLN
INTRAVENOUS | Status: AC
  Filled 2018-12-07: qty 25

## 2018-12-07 MED ORDER — PROTAMINE SULFATE 10 MG/ML IV SOLN
INTRAVENOUS | Status: DC | PRN
  Administered 2018-12-07: 120 mg via INTRAVENOUS

## 2018-12-07 MED ORDER — GLYCOPYRROLATE PF 0.2 MG/ML IJ SOSY
PREFILLED_SYRINGE | INTRAMUSCULAR | Status: AC
  Filled 2018-12-07: qty 1

## 2018-12-07 MED ORDER — CHLORHEXIDINE GLUCONATE 4 % EX LIQD
30.0000 mL | CUTANEOUS | Status: DC
  Filled 2018-12-07: qty 30

## 2018-12-07 MED ORDER — HEPARIN SODIUM (PORCINE) 1000 UNIT/ML IJ SOLN
INTRAMUSCULAR | Status: DC | PRN
  Administered 2018-12-07: 12000 [IU] via INTRAVENOUS

## 2018-12-07 MED ORDER — OXYCODONE HCL 5 MG PO TABS: 5.0000 mg | ORAL_TABLET | ORAL | Status: DC | PRN

## 2018-12-07 MED ORDER — MORPHINE SULFATE (PF) 4 MG/ML IV SOLN: 1.0000 mg | INTRAVENOUS | Status: DC | PRN

## 2018-12-07 MED ORDER — SODIUM CHLORIDE 0.9 % IV SOLN: 250.0000 mL | INTRAVENOUS | Status: DC | PRN

## 2018-12-07 MED ORDER — NITROGLYCERIN IN D5W 200-5 MCG/ML-% IV SOLN: 0.0000 ug/min | INTRAVENOUS | Status: DC

## 2018-12-07 MED ORDER — FENTANYL CITRATE (PF) 250 MCG/5ML IJ SOLN
INTRAMUSCULAR | Status: AC
  Filled 2018-12-07: qty 5

## 2018-12-07 MED ORDER — LIDOCAINE HCL 1 % IJ SOLN
INTRAMUSCULAR | Status: DC | PRN
  Administered 2018-12-07: 20 mL via INTRADERMAL

## 2018-12-07 MED ORDER — CLOPIDOGREL BISULFATE 75 MG PO TABS
75.0000 mg | ORAL_TABLET | Freq: Every day | ORAL | Status: DC
  Administered 2018-12-08: 75 mg via ORAL
  Filled 2018-12-07: qty 1

## 2018-12-07 MED ORDER — VANCOMYCIN HCL IN DEXTROSE 1-5 GM/200ML-% IV SOLN
1000.0000 mg | Freq: Once | INTRAVENOUS | Status: AC
  Administered 2018-12-07: 1000 mg via INTRAVENOUS
  Filled 2018-12-07: qty 200

## 2018-12-07 MED ORDER — FENTANYL CITRATE (PF) 100 MCG/2ML IJ SOLN
INTRAMUSCULAR | Status: DC | PRN
  Administered 2018-12-07: 50 ug via INTRAVENOUS

## 2018-12-07 MED ORDER — LIDOCAINE HCL 1 % IJ SOLN
INTRAMUSCULAR | Status: AC
  Filled 2018-12-07: qty 20

## 2018-12-07 MED ORDER — ACETAMINOPHEN 325 MG PO TABS: 650.0000 mg | ORAL_TABLET | Freq: Four times a day (QID) | ORAL | Status: DC | PRN

## 2018-12-07 MED ORDER — PHENYLEPHRINE 40 MCG/ML (10ML) SYRINGE FOR IV PUSH (FOR BLOOD PRESSURE SUPPORT)
PREFILLED_SYRINGE | INTRAVENOUS | Status: DC | PRN
  Administered 2018-12-07: 40 ug via INTRAVENOUS

## 2018-12-07 MED ORDER — SODIUM CHLORIDE 0.9 % IV SOLN
INTRAVENOUS | Status: DC
  Administered 2018-12-07: 10:00:00 via INTRAVENOUS

## 2018-12-07 MED ORDER — SODIUM CHLORIDE (PF) 0.9 % IJ SOLN
INTRAMUSCULAR | Status: AC
  Filled 2018-12-07: qty 20

## 2018-12-07 MED ORDER — CHLORHEXIDINE GLUCONATE 0.12 % MT SOLN
OROMUCOSAL | Status: AC
  Administered 2018-12-07: 15 mL via OROMUCOSAL
  Filled 2018-12-07: qty 15

## 2018-12-07 SURGICAL SUPPLY — 82 items
BAG DECANTER FOR FLEXI CONT (MISCELLANEOUS) IMPLANT
BAG SNAP BAND KOVER 36X36 (MISCELLANEOUS) ×3 IMPLANT
BLADE CLIPPER SURG (BLADE) ×3 IMPLANT
BLADE OSCILLATING /SAGITTAL (BLADE) IMPLANT
BLADE STERNUM SYSTEM 6 (BLADE) IMPLANT
BLADE SURG 10 STRL SS (BLADE) IMPLANT
CABLE ADAPT CONN TEMP 6FT (ADAPTER) ×3 IMPLANT
CATH DIAG EXPO 6F AL1 (CATHETERS) IMPLANT
CATH DIAG EXPO 6F VENT PIG 145 (CATHETERS) ×6 IMPLANT
CATH EXTERNAL FEMALE PUREWICK (CATHETERS) IMPLANT
CATH INFINITI 6F AL2 (CATHETERS) ×2 IMPLANT
CATH S G BIP PACING (CATHETERS) ×3 IMPLANT
CHLORAPREP W/TINT 26 (MISCELLANEOUS) ×3 IMPLANT
CLIP VESOCCLUDE MED 24/CT (CLIP) IMPLANT
CLIP VESOCCLUDE SM WIDE 24/CT (CLIP) IMPLANT
CLOSURE MYNX CONTROL 6F/7F (Vascular Products) ×2 IMPLANT
CONT SPEC 4OZ CLIKSEAL STRL BL (MISCELLANEOUS) ×6 IMPLANT
COVER BACK TABLE 80X110 HD (DRAPES) IMPLANT
COVER WAND RF STERILE (DRAPES) ×3 IMPLANT
DECANTER SPIKE VIAL GLASS SM (MISCELLANEOUS) ×3 IMPLANT
DERMABOND ADVANCED (GAUZE/BANDAGES/DRESSINGS) ×2
DERMABOND ADVANCED .7 DNX12 (GAUZE/BANDAGES/DRESSINGS) ×1 IMPLANT
DEVICE CLOSURE PERCLS PRGLD 6F (VASCULAR PRODUCTS) ×2 IMPLANT
DRAPE INCISE IOBAN 66X45 STRL (DRAPES) IMPLANT
DRSG TEGADERM 4X4.75 (GAUZE/BANDAGES/DRESSINGS) ×4 IMPLANT
ELECT CAUTERY BLADE 6.4 (BLADE) IMPLANT
ELECT REM PT RETURN 9FT ADLT (ELECTROSURGICAL) ×6
ELECTRODE REM PT RTRN 9FT ADLT (ELECTROSURGICAL) ×2 IMPLANT
FELT TEFLON 6X6 (MISCELLANEOUS) ×3 IMPLANT
GAUZE SPONGE 4X4 12PLY STRL (GAUZE/BANDAGES/DRESSINGS) ×3 IMPLANT
GLOVE BIO SURGEON STRL SZ7.5 (GLOVE) IMPLANT
GLOVE BIO SURGEON STRL SZ8 (GLOVE) IMPLANT
GLOVE EUDERMIC 7 POWDERFREE (GLOVE) IMPLANT
GLOVE ORTHO TXT STRL SZ7.5 (GLOVE) IMPLANT
GOWN STRL REUS W/ TWL LRG LVL3 (GOWN DISPOSABLE) IMPLANT
GOWN STRL REUS W/ TWL XL LVL3 (GOWN DISPOSABLE) ×1 IMPLANT
GOWN STRL REUS W/TWL LRG LVL3 (GOWN DISPOSABLE)
GOWN STRL REUS W/TWL XL LVL3 (GOWN DISPOSABLE) ×2
GUIDEWIRE SAF TJ AMPL .035X180 (WIRE) ×3 IMPLANT
GUIDEWIRE SAFE TJ AMPLATZ EXST (WIRE) ×3 IMPLANT
GUIDEWIRE WHOLEY .035 145 JTIP (WIRE) ×2 IMPLANT
INSERT FOGARTY SM (MISCELLANEOUS) IMPLANT
KIT BASIN OR (CUSTOM PROCEDURE TRAY) ×3 IMPLANT
KIT HEART LEFT (KITS) ×3 IMPLANT
KIT SUCTION CATH 14FR (SUCTIONS) IMPLANT
KIT TURNOVER KIT B (KITS) ×3 IMPLANT
LOOP VESSEL MAXI BLUE (MISCELLANEOUS) IMPLANT
LOOP VESSEL MINI RED (MISCELLANEOUS) IMPLANT
NS IRRIG 1000ML POUR BTL (IV SOLUTION) ×3 IMPLANT
PACK ENDO MINOR (CUSTOM PROCEDURE TRAY) ×3 IMPLANT
PAD ARMBOARD 7.5X6 YLW CONV (MISCELLANEOUS) ×6 IMPLANT
PAD ELECT DEFIB RADIOL ZOLL (MISCELLANEOUS) ×3 IMPLANT
PENCIL BUTTON HOLSTER BLD 10FT (ELECTRODE) IMPLANT
PERCLOSE PROGLIDE 6F (VASCULAR PRODUCTS) ×6
POSITIONER HEAD DONUT 9IN (MISCELLANEOUS) ×3 IMPLANT
SET MICROPUNCTURE 5F STIFF (MISCELLANEOUS) ×3 IMPLANT
SHEATH BRITE TIP 7FR 35CM (SHEATH) ×3 IMPLANT
SHEATH PINNACLE 6F 10CM (SHEATH) ×3 IMPLANT
SHEATH PINNACLE 8F 10CM (SHEATH) ×3 IMPLANT
SLEEVE REPOSITIONING LENGTH 30 (MISCELLANEOUS) ×3 IMPLANT
STOPCOCK MORSE 400PSI 3WAY (MISCELLANEOUS) ×6 IMPLANT
SUT ETHIBOND X763 2 0 SH 1 (SUTURE) IMPLANT
SUT GORETEX CV 4 TH 22 36 (SUTURE) IMPLANT
SUT GORETEX CV4 TH-18 (SUTURE) IMPLANT
SUT PROLENE 5 0 C 1 36 (SUTURE) IMPLANT
SUT PROLENE 6 0 C 1 30 (SUTURE) IMPLANT
SUT SILK  1 MH (SUTURE) ×2
SUT SILK 1 MH (SUTURE) ×1 IMPLANT
SUT VIC AB 2-0 CT1 27 (SUTURE)
SUT VIC AB 2-0 CT1 TAPERPNT 27 (SUTURE) IMPLANT
SUT VIC AB 2-0 CTX 36 (SUTURE) IMPLANT
SUT VIC AB 3-0 SH 8-18 (SUTURE) IMPLANT
SYR 50ML LL SCALE MARK (SYRINGE) ×3 IMPLANT
SYR BULB IRRIGATION 50ML (SYRINGE) IMPLANT
SYR MEDRAD MARK V 150ML (SYRINGE) ×3 IMPLANT
TOWEL GREEN STERILE (TOWEL DISPOSABLE) ×3 IMPLANT
TOWEL GREEN STERILE FF (TOWEL DISPOSABLE) ×3 IMPLANT
TRANSDUCER W/STOPCOCK (MISCELLANEOUS) ×6 IMPLANT
TRAY FOLEY SLVR 16FR TEMP STAT (SET/KITS/TRAYS/PACK) IMPLANT
VALVE 26 ULTRA SAPIEN KIT (Valve) ×2 IMPLANT
WIRE EMERALD 3MM-J .035X150CM (WIRE) ×3 IMPLANT
WIRE EMERALD 3MM-J .035X260CM (WIRE) ×3 IMPLANT

## 2018-12-07 NOTE — Progress Notes (Signed)
Patient ID: Emily Trujillo, female   DOB: Sep 17, 1940, 78 y.o.   MRN: [id number] TCTS   Hemodynamically stable in sinus rhythm. Still sleepy but responds appropriately.  Appears neuro intact Groin sites ok Hgb 10.5  Transferring to 4E soon.

## 2018-12-07 NOTE — Anesthesia Procedure Notes (Signed)
Arterial Line Insertion Start/End10/20/2020 10:10 AM Performed by: Janace Litten, CRNA, CRNA  Preanesthetic checklist: patient identified, IV checked, risks and benefits discussed and surgical consent Lidocaine 1% used for infiltration Left, radial was placed Catheter size: 20 G Hand hygiene performed  and maximum sterile barriers used  Allen's test indicative of satisfactory collateral circulation Attempts: 1 Procedure performed without using ultrasound guided technique. Following insertion, dressing applied and Biopatch. Post procedure assessment: normal  Patient tolerated the procedure well with no immediate complications.

## 2018-12-07 NOTE — Anesthesia Preprocedure Evaluation (Addendum)
Anesthesia Evaluation  Patient identified by MRN, date of birth, ID band Patient awake    Reviewed: Allergy & Precautions, NPO status , Patient's Chart, lab work & pertinent test results  History of Anesthesia Complications Negative for: history of anesthetic complications  Airway Mallampati: II  TM Distance: >3 FB Neck ROM: Full    Dental  (+) Dental Advisory Given, Edentulous Lower, Edentulous Upper   Pulmonary former smoker,    Pulmonary exam normal breath sounds clear to auscultation       Cardiovascular hypertension, Pt. on medications (-) angina+CHF  (-) Cardiac Stents + Valvular Problems/Murmurs AS  Rhythm:Regular Rate:Normal + Systolic murmurs    Neuro/Psych negative neurological ROS  negative psych ROS   GI/Hepatic negative GI ROS, Neg liver ROS,   Endo/Other  negative endocrine ROS  Renal/GU Renal InsufficiencyRenal disease     Musculoskeletal negative musculoskeletal ROS (+)   Abdominal   Peds  Hematology negative hematology ROS (+)   Anesthesia Other Findings Day of surgery medications reviewed with the patient.  Reproductive/Obstetrics                            Anesthesia Physical Anesthesia Plan  ASA: IV  Anesthesia Plan: MAC   Post-op Pain Management:    Induction: Intravenous  PONV Risk Score and Plan: 2 and Treatment may vary due to age or medical condition  Airway Management Planned: Nasal Cannula and Natural Airway  Additional Equipment: Arterial line  Intra-op Plan:   Post-operative Plan:   Informed Consent: I have reviewed the patients History and Physical, chart, labs and discussed the procedure including the risks, benefits and alternatives for the proposed anesthesia with the patient or authorized representative who has indicated his/her understanding and acceptance.     Dental advisory given  Plan Discussed with: CRNA and  Anesthesiologist  Anesthesia Plan Comments:         Anesthesia Quick Evaluation

## 2018-12-07 NOTE — CV Procedure (Signed)
[facility]  T[facility]R OPERATIVE NOTE   Date of Procedure:  12/07/2018  Preoperative Diagnosis: Severe Aortic Stenosis   Postoperative Diagnosis: Same   Procedure:    Transcatheter Aortic Valve Replacement - Transfemoral Approach  Edwards Sapien 3 THV (size 26 mm, model #[id number], serial # [id number])   Co-Surgeons:  Lauree Chandler, MD and [name], MD   Anesthesiologist:  Gifford Shave  Echocardiographer:  Johnsie Cancel  Pre-operative Echo Findings:  Severe aortic stenosis  Normal left ventricular systolic function  Post-operative Echo Findings:  No paravalvular leak  Normal left ventricular systolic function  BRIEF CLINICAL NOTE AND INDICATIONS FOR SURGERY  78 yo female with history of chronic diastolic CHF, HLD, HTN, tobacco abuse and severe aortic stenosis who is here today for TAVR. She was admitted to [facility] in [place], [place] August 2020 with progressive dyspnea. She was treated for possible pneumonia and diuresed with improvement in symptoms. She was seen by Dr. Virgina Jock 11/12/18. Echo with critical AS. Echo 11/23/18 with LVEF=50-55%. The aortic valve leaflets are thickened and calcified with severe aortic stenosis, mean gradient 83 mmHg, peak gradient 101 mmHg, AVA 0.75 cm2. There is at least moderate aortic valve insufficiency. There is mild mitral regurgitation. Cardiac cath 11/23/18 with mild non-obstructive disease. She has dyspnea with moderate exertion. No lower extremity edema. No chest pain.  During the course of the patient's preoperative work up they have been evaluated comprehensively by a multidisciplinary team of specialists coordinated through the [facility] in the [facility].  They have been demonstrated to suffer from symptomatic severe aortic stenosis as noted above. The patient has been counseled extensively as to the relative risks and benefits of all options for the treatment of severe  aortic stenosis including long term medical therapy, conventional surgery for aortic valve replacement, and transcatheter aortic valve replacement.  The patient has been independently evaluated by Dr. Cyndia Bent with CT surgery and they are felt to be at high risk for conventional surgical aortic valve replacement. The surgeon indicated the patient would be a poor candidate for conventional surgery. Based upon review of all of the patient's preoperative diagnostic tests they are felt to be candidate for transcatheter aortic valve replacement using the transfemoral approach as an alternative to high risk conventional surgery.    Following the decision to proceed with transcatheter aortic valve replacement, a discussion has been held regarding what types of management strategies would be attempted intraoperatively in the event of life-threatening complications, including whether or not the patient would be considered a candidate for the use of cardiopulmonary bypass and/or conversion to open sternotomy for attempted surgical intervention.  The patient has been advised of a variety of complications that might develop peculiar to this approach including but not limited to risks of death, stroke, paravalvular leak, aortic dissection or other major vascular complications, aortic annulus rupture, device embolization, cardiac rupture or perforation, acute myocardial infarction, arrhythmia, heart block or bradycardia requiring permanent pacemaker placement, congestive heart failure, respiratory failure, renal failure, pneumonia, infection, other late complications related to structural valve deterioration or migration, or other complications that might ultimately cause a temporary or permanent loss of functional independence or other long term morbidity.  The patient provides full informed consent for the procedure as described and all questions were answered preoperatively.    DETAILS OF THE OPERATIVE  PROCEDURE  PREPARATION:   The patient is brought to the operating room on the above mentioned date and central monitoring was  established by the anesthesia team including placement of a radial arterial line. The patient is placed in the supine position on the operating table.  Intravenous antibiotics are administered. Conscious sedation is used.   Baseline transthoracic echocardiogram was performed. The patient's chest, abdomen, both groins, and both lower extremities are prepared and draped in a sterile manner. A time out procedure is performed.   PERIPHERAL ACCESS:   Using the modified Seldinger technique, femoral arterial and venous access were obtained with placement of 6 Fr sheaths on the left side using u/s guidance.  A pigtail diagnostic catheter was passed through the femoral arterial sheath under fluoroscopic guidance into the aortic root.  A temporary transvenous pacemaker catheter was passed through the femoral venous sheath under fluoroscopic guidance into the right ventricle.  The pacemaker was tested to ensure stable lead placement and pacemaker capture. Aortic root angiography was performed in order to determine the optimal angiographic angle for valve deployment.  TRANSFEMORAL ACCESS:  A micropuncture kit was used to gain access to the right femoral artery using u/s guidance. Position confirmed with angiography. Pre-closure with double ProGlide closure devices. The patient was heparinized systemically and ACT verified > 250 seconds.    A 14 Fr transfemoral E-sheath was introduced into the right femoral artery after progressively dilating over an Amplatz superstiff wire. An AL-1 catheter was used to direct a straight-tip exchange length wire across the native aortic valve into the left ventricle. This was exchanged out for a pigtail catheter and position was confirmed in the LV apex. Simultaneous LV and Ao pressures were recorded.  The pigtail catheter was then exchanged for an Amplatz  Extra-stiff wire in the LV apex.   TRANSCATHETER HEART VALVE DEPLOYMENT:  An Edwards Sapien 3 THV (size 26 mm) was prepared and crimped per manufacturer's guidelines, and the proper orientation of the valve is confirmed on the [employer] delivery system. The valve was advanced through the introducer sheath using normal technique until in an appropriate position in the abdominal aorta beyond the sheath tip. The balloon was then retracted and using the fine-tuning wheel was centered on the valve. The valve was then advanced across the aortic arch using appropriate flexion of the catheter. The valve was carefully positioned across the aortic valve annulus. The Commander catheter was retracted using normal technique. Once final position of the valve has been confirmed by angiographic assessment, the valve is deployed while temporarily holding ventilation and during rapid ventricular pacing to maintain systolic blood pressure < 50 mmHg and pulse pressure < 10 mmHg. The balloon inflation is held for >3 seconds after reaching full deployment volume. Once the balloon has fully deflated the balloon is retracted into the ascending aorta and valve function is assessed using TTE. There is felt to be no paravalvular leak and no central aortic insufficiency.  The patient's hemodynamic recovery following valve deployment is good.  The deployment balloon and guidewire are both removed. Echo demostrated acceptable post-procedural gradients, stable mitral valve function, and no AI.   PROCEDURE COMPLETION:  The sheath was then removed and closure devices were completed. Protamine was administered once femoral arterial repair was complete. The temporary pacemaker, pigtail catheters and femoral sheaths were removed with a Mynx closure device placed in the right femoral artery and manual pressure used for venous hemostasis.   The patient tolerated the procedure well and is transported to the surgical intensive care in  stable condition. There were no immediate intraoperative complications. All sponge instrument and needle counts are verified  correct at completion of the operation.   No blood products were administered during the operation.  The patient received a total of 101 mL of intravenous contrast during the procedure.  Lauree Chandler MD 12/07/2018 2:29 PM

## 2018-12-07 NOTE — Plan of Care (Signed)
Poc progressing.  

## 2018-12-07 NOTE — Anesthesia Procedure Notes (Signed)
Procedure Name: MAC Date/Time: 12/07/2018 12:17 PM Performed by: Barrington Ellison, CRNA Pre-anesthesia Checklist: Patient identified, Emergency Drugs available, Suction available, Patient being monitored and Timeout performed Patient Re-evaluated:Patient Re-evaluated prior to induction Oxygen Delivery Method: Simple face mask

## 2018-12-07 NOTE — Progress Notes (Signed)
  Echocardiogram 2D Echocardiogram has been performed.  Emily Trujillo 12/07/2018, 2:12 PM

## 2018-12-07 NOTE — Interval H&P Note (Signed)
History and Physical Interval Note:  12/07/2018 11:04 AM  [name]  has presented today for surgery, with the diagnosis of Severe Aortic Stenosis.  The various methods of treatment have been discussed with the patient and family. After consideration of risks, benefits and other options for treatment, the patient has consented to  Procedure(s): TRANSCATHETER AORTIC VALVE REPLACEMENT, TRANSFEMORAL (N/A) TRANSESOPHAGEAL ECHOCARDIOGRAM (TEE) (N/A) as a surgical intervention.  The patient's history has been reviewed, patient examined, no change in status, stable for surgery.  I have reviewed the patient's chart and labs.  Questions were answered to the patient's satisfaction.     [name]

## 2018-12-07 NOTE — Op Note (Signed)
[facility]   MULTIDISCIPLINARY HEART VALVE TEAM   TAVR OPERATIVE NOTE   Date of Procedure:  12/07/2018  Preoperative Diagnosis: Severe Aortic Stenosis   Postoperative Diagnosis: Same   Procedure:    Transcatheter Aortic Valve Replacement - Percutaneous Right Transfemoral Approach  Edwards Sapien 3 Ultra THV (size 26 mm, model # 9750TFX, serial # [id number])   Co-Surgeons:  [name], MD and Lauree Chandler, MD   Anesthesiologist:  S. Gifford Shave, MD  Echocardiographer:  P. Johnsie Cancel, MD  Pre-operative Echo Findings:  Severe aortic stenosis  Normal left ventricular systolic function  Post-operative Echo Findings:  No paravalvular leak  Normal left ventricular systolic function   BRIEF CLINICAL NOTE AND INDICATIONS FOR SURGERY  This 78 year old woman has stage D, severe, symptomatic aortic stenosis with New York Heart Association class III symptoms of exertional fatigue and shortness of breath consistent with chronic diastolic congestive heart failure. I have personally reviewed her 2D echocardiogram, cardiac catheterization, and CTA studies. Echocardiogram shows a trileaflet aortic valve with severe calcification particularly of the noncoronary leaflet with restricted mobility. The mean gradient is 83 mmHg consistent with critical aortic stenosis. Left ventricular systolic function is normal. Cardiac catheterization showed mild nonobstructive coronary disease. I agree that aortic valve replacement is indicated in this patient with critical aortic stenosis. Given her age transcatheter aortic valve replacement would be the best treatment for her. Her gated cardiac CTA shows anatomy suitable for transcatheter aortic valve replacement using a Sapien 3 valve. Left main high was only measured at 8.8 mm but her sinus dimension is 31 mm and there is minimal calcification in the left coronary leaflet. Her abdominal and pelvic CTA shows adequate pelvic vascular anatomy to  allow transfemoral insertion. A significant incidental finding on her CT scan of the chest is that she has a 1.5 cm x 1.1 cm macrolobulated nodule in the right lower lobe of the lung that is nonspecific but primary bronchogenic carcinoma will have to be ruled out. I think it would be best to proceed with transcatheter aortic valve replacement given the critical nature of her aortic stenosis with follow-up of the lung nodule with a PET CT scan in 3 months. I reviewed the CT images with the patient and her daughter-in-law and answered all their questions concerning this.  The patient and her daughter-in-law were counseled at length regarding treatment alternatives for management of severe symptomatic aortic stenosis. The risks and benefits of surgical intervention has been discussed in detail. Long-term prognosis with medical therapy was discussed. Alternative approaches such as conventional surgical aortic valve replacement, transcatheter aortic valve replacement, and palliative medical therapy were compared and contrasted at length. This discussion was placed in the context of the patient's own specific clinical presentation and past medical history. All of their questions have been addressed.  Following the decision to proceed with transcatheter aortic valve replacement, a discussion was held regarding what types of management strategies would be attempted intraoperatively in the event of life-threatening complications, including whether or not the patient would be considered a candidate for the use of cardiopulmonary bypass and/or conversion to open sternotomy for attempted surgical intervention. I think she is a candidate for emergent sternotomy intraoperative complications.  The patient has been advised of a variety of complications that might develop including but not limited to risks of death, stroke, paravalvular leak, aortic dissection or other major vascular complications, aortic annulus rupture, device  embolization, cardiac rupture or perforation, mitral regurgitation, acute myocardial infarction, arrhythmia, heart block  or bradycardia requiring permanent pacemaker placement, congestive heart failure, respiratory failure, renal failure, pneumonia, infection, other late complications related to structural valve deterioration or migration, or other complications that might ultimately cause a temporary or permanent loss of functional independence or other long term morbidity. The patient provides full informed consent for the procedure as described and all questions were answered.     DETAILS OF THE OPERATIVE PROCEDURE  PREPARATION:    The patient is brought to the operating room on the above mentioned date and appropriate monitoring was established by the anesthesia team. The patient is placed in the supine position on the operating table.  Intravenous antibiotics are administered. The patient is monitored closely throughout the procedure under conscious sedation.  Baseline transthoracic echocardiogram was performed. The patient's abdomen and both groins are prepared and draped in a sterile manner. A time out procedure is performed.   PERIPHERAL ACCESS:    Using the modified Seldinger technique, femoral arterial and venous access was obtained with placement of 6 Fr sheaths on the left side.  A pigtail diagnostic catheter was passed through the left arterial sheath under fluoroscopic guidance into the aortic root.  A temporary transvenous pacemaker catheter was passed through the ledt femoral venous sheath under fluoroscopic guidance into the right ventricle.  The pacemaker was tested to ensure stable lead placement and pacemaker capture. Aortic root angiography was performed in order to determine the optimal angiographic angle for valve deployment.   TRANSFEMORAL ACCESS:   Percutaneous transfemoral access and sheath placement was performed using ultrasound guidance.  The right common femoral  artery was cannulated using a micropuncture needle and appropriate location was verified using hand injection angiogram.  A pair of Abbott Perclose percutaneous closure devices were placed and a 6 French sheath replaced into the femoral artery.  The patient was heparinized systemically and ACT verified > 250 seconds.    A 14 Fr transfemoral E-sheath was introduced into the right common femoral artery after progressively dilating over an Amplatz superstiff wire. An AL-1 catheter was used to direct a straight-tip exchange length wire across the native aortic valve into the left ventricle. This was exchanged out for a pigtail catheter and position was confirmed in the LV apex.    BALLOON AORTIC VALVULOPLASTY:   Not performed   TRANSCATHETER HEART VALVE DEPLOYMENT:   An Edwards Sapien 3 Ultra transcatheter heart valve (size 26 mm, model #[id number], serial [id number]) was prepared and crimped per manufacturer's guidelines, and the proper orientation of the valve is confirmed on the [employer] delivery system. The valve was advanced through the introducer sheath using normal technique until in an appropriate position in the abdominal aorta beyond the sheath tip. The balloon was then retracted and using the fine-tuning wheel was centered on the valve. The valve was then advanced across the aortic arch using appropriate flexion of the catheter. The valve was carefully positioned across the aortic valve annulus. The Commander catheter was retracted using normal technique. Once final position of the valve has been confirmed by angiographic assessment, the valve is deployed while temporarily holding ventilation and during rapid ventricular pacing to maintain systolic blood pressure < 50 mmHg and pulse pressure < 10 mmHg. The balloon inflation is held for >3 seconds after reaching full deployment volume. Once the balloon has fully deflated the balloon is retracted into the ascending aorta and valve function is  assessed using echocardiography. There is felt to be no paravalvular leak and no central aortic insufficiency.  The patient's hemodynamic  recovery following valve deployment is good.  The deployment balloon and guidewire are both removed.    PROCEDURE COMPLETION:   The sheath was removed and femoral artery closure performed.  Protamine was administered once femoral arterial repair was complete. The temporary pacemaker, pigtail catheters and femoral sheaths were removed with manual pressure used for hemostasis.  A Mynx femoral closure device was utilized following removal of the diagnostic sheath in the left femoral artery.  The patient tolerated the procedure well and is transported to the surgical intensive care in stable condition. There were no immediate intraoperative complications. All sponge instrument and needle counts are verified correct at completion of the operation.   No blood products were administered during the operation.  The patient received a total of 101 mL of intravenous contrast during the procedure.   [name], MD 12/07/2018

## 2018-12-07 NOTE — Anesthesia Postprocedure Evaluation (Signed)
Anesthesia Post Note  Patient: Lavora Brisbon  Procedure(s) Performed: TRANSCATHETER AORTIC VALVE REPLACEMENT, TRANSFEMORAL (N/A Chest) TRANSESOPHAGEAL ECHOCARDIOGRAM (TEE) (N/A Chest)     Patient location during evaluation: PACU Anesthesia Type: MAC Level of consciousness: awake and alert Pain management: pain level controlled Vital Signs Assessment: post-procedure vital signs reviewed and stable Respiratory status: spontaneous breathing, nonlabored ventilation and respiratory function stable Cardiovascular status: stable and blood pressure returned to baseline Postop Assessment: no apparent nausea or vomiting Anesthetic complications: no    Last Vitals:  Vitals:   12/07/18 1510 12/07/18 1520  BP: (!) 159/56 (!) 152/68  Pulse: (!) 58 (!) 52  Resp: (!) 24 19  Temp:    SpO2: 100% 100%    Last Pain:  Vitals:   12/07/18 1504  TempSrc: Temporal  PainSc: [place]

## 2018-12-07 NOTE — Transfer of Care (Signed)
Immediate Anesthesia Transfer of Care Note  Patient: Genine Beckett  Procedure(s) Performed: TRANSCATHETER AORTIC VALVE REPLACEMENT, TRANSFEMORAL (N/A Chest) TRANSESOPHAGEAL ECHOCARDIOGRAM (TEE) (N/A Chest)  Patient Location: Cath Lab  Anesthesia Type:MAC  Level of Consciousness: drowsy and patient cooperative  Airway & Oxygen Therapy: Patient Spontanous Breathing and Patient connected to face mask oxygen  Post-op Assessment: Report given to RN  Post vital signs: Reviewed and stable  Last Vitals:  Vitals Value Taken Time  BP 102/47 12/07/18 1433  Temp 36.6 C 12/07/18 1433  Pulse 64 12/07/18 1438  Resp 23 12/07/18 1438  SpO2 100 % 12/07/18 1438  Vitals shown include unvalidated device data.  Last Pain:  Vitals:   12/07/18 1433  TempSrc: Temporal  PainSc: Asleep      Patients Stated Pain Goal: 3 (12/19/09 1735)  Complications: No apparent anesthesia complications

## 2018-12-08 ENCOUNTER — Encounter (HOSPITAL_COMMUNITY): Payer: Self-pay | Admitting: Cardiovascular Disease

## 2018-12-08 ENCOUNTER — Other Ambulatory Visit: Payer: Self-pay | Admitting: Cardiology

## 2018-12-08 ENCOUNTER — Inpatient Hospital Stay (HOSPITAL_COMMUNITY): Payer: Medicare Other

## 2018-12-08 DIAGNOSIS — I350 Nonrheumatic aortic (valve) stenosis: Secondary | ICD-10-CM

## 2018-12-08 DIAGNOSIS — Z954 Presence of other heart-valve replacement: Secondary | ICD-10-CM

## 2018-12-08 DIAGNOSIS — Z952 Presence of prosthetic heart valve: Secondary | ICD-10-CM

## 2018-12-08 LAB — MAGNESIUM: Magnesium: 2 mg/dL (ref 1.7–2.4)

## 2018-12-08 LAB — ECHOCARDIOGRAM COMPLETE
Height: 61.5 in
Weight: 2825.42 oz

## 2018-12-08 LAB — CBC
HCT: 33.6 % — ABNORMAL LOW (ref 36.0–46.0)
Hemoglobin: 10.6 g/dL — ABNORMAL LOW (ref 12.0–15.0)
MCH: 29.4 pg (ref 26.0–34.0)
MCHC: 31.5 g/dL (ref 30.0–36.0)
MCV: 93.3 fL (ref 80.0–100.0)
Platelets: 166 10*3/uL (ref 150–400)
RBC: 3.6 MIL/uL — ABNORMAL LOW (ref 3.87–5.11)
RDW: 14.3 % (ref 11.5–15.5)
WBC: 7.4 10*3/uL (ref 4.0–10.5)
nRBC: 0 % (ref 0.0–0.2)

## 2018-12-08 LAB — BASIC METABOLIC PANEL
Anion gap: 7 (ref 5–15)
BUN: 18 mg/dL (ref 8–23)
CO2: 23 mmol/L (ref 22–32)
Calcium: 8.5 mg/dL — ABNORMAL LOW (ref 8.9–10.3)
Chloride: 111 mmol/L (ref 98–111)
Creatinine, Ser: 1.17 mg/dL — ABNORMAL HIGH (ref 0.44–1.00)
GFR calc Af Amer: 51 mL/min — ABNORMAL LOW (ref >60)
GFR calc non Af Amer: 44 mL/min — ABNORMAL LOW (ref >60)
Glucose, Bld: 103 mg/dL — ABNORMAL HIGH (ref 70–99)
Potassium: 4.3 mmol/L (ref 3.5–5.1)
Sodium: 141 mmol/L (ref 135–145)

## 2018-12-08 MED ORDER — ASPIRIN 81 MG PO CHEW: 81.0000 mg | CHEWABLE_TABLET | Freq: Every day | ORAL | 3 refills | Status: AC

## 2018-12-08 MED ORDER — CLOPIDOGREL BISULFATE 75 MG PO TABS: 75.0000 mg | ORAL_TABLET | Freq: Every day | ORAL | 1 refills | Status: DC

## 2018-12-08 MED FILL — Magnesium Sulfate Inj 50%: INTRAMUSCULAR | Qty: 10 | Status: AC

## 2018-12-08 MED FILL — Potassium Chloride Inj 2 mEq/ML: INTRAVENOUS | Qty: 40 | Status: AC

## 2018-12-08 MED FILL — Heparin Sodium (Porcine) Inj 1000 Unit/ML: INTRAMUSCULAR | Qty: 30 | Status: AC

## 2018-12-08 NOTE — Progress Notes (Signed)
CARDIAC REHAB PHASE I   PRE:  Rate/Rhythm: 43 SB  BP:  Sitting: 115/66      SaO2: 92 RA  MODE:  Ambulation: 300 ft   POST:  Rate/Rhythm: 86 SR  BP:  Sitting: 126/87    SaO2: 94 RA  Pt ambulated 345ft in hallway standby assist with steady gait. Pt states breathing is better than preop. Encouraged continued ambulation with emphasis on safety. Reviewed restrictions and site care. Pt declining CRP II at this time. Pt anxious for d/c.  [id number] Rufina Falco, RN BSN 12/08/2018 10:32 AM

## 2018-12-08 NOTE — Discharge Instructions (Signed)
ACTIVITY AND EXERCISE °• Daily activity and exercise are an important part of your recovery. People recover at different rates depending on their general health and type of valve procedure. °• Most people recovering from TAVR feel better relatively quickly  °• No lifting, pushing, pulling more than 10 pounds (examples to avoid: groceries, vacuuming, gardening, golfing): °            - For one week with a procedure through the groin. °            - For six weeks for procedures through the chest wall or neck. °NOTE: You will typically see one of our providers 7-14 days after your procedure to discuss WHEN TO RESUME the above activities.  °  °  °DRIVING °• Do not drive until you are seen for follow up and cleared by a provider. Generally, we ask patient to not drive for 1 week after their procedure. °• If you have been told by your doctor in the past that you may not drive, you must talk with him/her before you begin driving again. °  °DRESSING °• Groin site: you may leave the clear dressing over the site for up to one week or until it falls off. °  °HYGIENE °• If you had a femoral (leg) procedure, you may take a shower when you return home. After the shower, pat the site dry. Do NOT use powder, oils or lotions in your groin area until the site has completely healed. °• If you had a chest procedure, you may shower when you return home unless specifically instructed not to by your discharging practitioner. °            - DO NOT scrub incision; pat dry with a towel. °            - DO NOT apply any lotions, oils, powders to the incision. °            - No tub baths / swimming for at least 2 weeks. °• If you notice any fevers, chills, increased pain, swelling, bleeding or pus, please contact your doctor. °  °ADDITIONAL INFORMATION °• If you are going to have an upcoming dental procedure, please contact our office as you will require antibiotics ahead of time to prevent infection on your heart valve.  ° ° °If you have any  questions or concerns you can call the structural heart phone during normal business hours 8am-4pm. If you have an urgent need after hours or weekends please call [phone] to talk to the on call provider for general cardiology. If you have an emergency that requires immediate attention, please call 911.  ° ° °After TAVR Checklist ° °Check  Test Description  ° Follow up appointment in 1-2 weeks  You will see our structural heart physician assistant, Katie Arian Murley. Your incision sites will be checked and you will be cleared to drive and resume all normal activities if you are doing well.    ° 1 month echo and follow up  You will have an echo to check on your new heart valve and be seen back in the office by Katie Regenia Erck. Many times the echo is not read by your appointment time, but Katie will call you later that day or the following day to report your results.  ° Follow up with your primary cardiologist You will need to be seen by your primary cardiologist in the following 3-6 months after your 1 month appointment in the valve   clinic. Often times your Plavix or Aspirin will be discontinued during this time, but this is decided on a case by case basis.   ° 1 year echo and follow up You will have another echo to check on your heart valve after 1 year and be seen back in the office by Katie Ayriana Wix. This your last structural heart visit.  ° Bacterial endocarditis prophylaxis  You will have to take antibiotics for the rest of your life before all dental procedures (even teeth cleanings) to protect your heart valve. Antibiotics are also required before some surgeries. Please check with your cardiologist before scheduling any surgeries. Also, please make sure to tell us if you have a penicillin allergy as you will require an alternative antibiotic.   ° ° °

## 2018-12-08 NOTE — Progress Notes (Signed)
1 Day Post-Op Procedure(s) (LRB): TRANSCATHETER AORTIC VALVE REPLACEMENT, TRANSFEMORAL (N/A) TRANSESOPHAGEAL ECHOCARDIOGRAM (TEE) (N/A) Subjective: No complaints. Ambulated without difficulty. Less congested this am.  Objective: Vital signs in last 24 hours: Temp:  [97.8 F (36.6 C)-99 F (37.2 C)] 99 F (37.2 C) (10/21 0400) Pulse Rate:  [50-66] 57 (10/21 0600) Cardiac Rhythm: Sinus bradycardia (10/21 0700) Resp:  [14-24] 22 (10/21 0600) BP: (88-159)/(31-68) 117/53 (10/21 0400) SpO2:  [90 %-100 %] 96 % (10/21 0600) Weight:  [79.6 kg-80.1 kg] 80.1 kg (10/21 0600)  Hemodynamic parameters for last 24 hours:    Intake/Output from previous day: 10/20 0701 - 10/21 0700 In: 1831.6 [P.O.:120; I.V.:1611.6; IV Piggyback:100] Out: 775 [Urine:725; Blood:50] Intake/Output this shift: No intake/output data recorded.  General appearance: alert and cooperative Neurologic: intact Heart: regular rate and rhythm, S1, S2 normal, no murmur Lungs: clear to auscultation bilaterally Extremities: extremities normal, atraumatic, no cyanosis or edema Wound: groin sites ok  Lab Results: Recent Labs    12/07/18 1439 12/08/18 0337  WBC  --  7.4  HGB 10.5* 10.6*  HCT 31.0* 33.6*  PLT  --  166   BMET:  Recent Labs    12/07/18 1439 12/08/18 0337  NA 143 141  K 4.7 4.3  CL 109 111  CO2  --  23  GLUCOSE 130* 103*  BUN 23 18  CREATININE 1.10* 1.17*  CALCIUM  --  8.5*    PT/INR: No results for input(s): LABPROT, INR in the last 72 hours. ABG    Component Value Date/Time   PHART 7.417 12/07/2018 1017   HCO3 22.8 12/07/2018 1017   TCO2 24 12/07/2018 1439   ACIDBASEDEF 1.1 12/07/2018 1017   O2SAT 97.9 12/07/2018 1017   CBG (last 3)  No results for input(s): GLUCAP in the last 72 hours.  ECG: sinus 60's with PAC's  S/P Procedure(s) (LRB): TRANSCATHETER AORTIC VALVE REPLACEMENT, TRANSFEMORAL (N/A) TRANSESOPHAGEAL ECHOCARDIOGRAM (TEE) (N/A)  She has remained hemodynamically  stable in sinus rhythm with no signs of heart block.   Ambulating well  2D echo being done.  Labs ok  Plan home today with daughter-in-law.  Plan PET/CT in about 3 months for follow up of RLL lung nodule.   LOS: 1 day    [name] 12/08/2018

## 2018-12-08 NOTE — Discharge Summary (Addendum)
[facility]VE TEAM  Discharge Summary    Patient ID: Emily Trujillo MRN: [id number]; DOB: [birth year]  Admit date: 12/07/2018 Discharge date: 12/08/2018  Primary Care Provider: Glenda Chroman, MD  Primary Cardiologist: Dr. Virgina Jock / Dr. Angelena Form & Dr. Cyndia Bent (TAVR)  Discharge Diagnoses    Principal Problem:   S/P TAVR (transcatheter aortic valve replacement) Active Problems:   Severe aortic stenosis   Pulmonary nodule   Hypertension   Dyslipidemia   Acute on chronic diastolic heart failure (HCC)   CKD (chronic kidney disease), stage III   Tobacco abuse   Allergies No Known Allergies  Diagnostic Studies/Procedures    TAVR OPERATIVE NOTE   Date of Procedure:                12/07/2018  Preoperative Diagnosis:      Severe Aortic Stenosis   Postoperative Diagnosis:    Same   Procedure:        Transcatheter Aortic Valve Replacement - Percutaneous Right Transfemoral Approach             Edwards Sapien 3 Ultra THV (size 26 mm, model # 9750TFX, serial # [id number])              Co-Surgeons:                        [name], MD and Lauree Chandler, MD   Anesthesiologist:                  Collins Scotland, MD  Echocardiographer:              Edmonia James, MD  Pre-operative Echo Findings: ? Severe aortic stenosis ? Normal left ventricular systolic function  Post-operative Echo Findings: ? No paravalvular leak ? Normal left ventricular systolic function  _____________   Echo 12/08/18: completed but pending formal read at the time of discharge.  History of Present Illness     Emily Trujillo is a 78 y.o. female with a history of HTN, HLD, CKD stage III, previous heavy tobacco abuse, chronic diastolic CHF with recent CHF admission and severe AS who presented to [facility] on 12/07/18 for planned TAVR.   She was recently admitted in 09/2018 in [place], [place] with progressive shortness of breath.  She was treated  for pneumonia and improved with diuresis.  She was seen by Dr. Virgina Jock last month and had a 2D echocardiogram showing critical aortic stenosis with a mean gradient of 83 mmHg and a peak gradient of 101 mmHg.  Aortic valve area was 0.75 cm.  Left ventricular ejection fraction was 50 to 55%. There was felt to be at least moderate insufficiency. There is mild mitral regurgitation.  She underwent cardiac catheterization on 11/23/2018 which showed mild nonobstructive coronary disease.  The patient has been evaluated by the multidisciplinary valve team and felt to have severe, symptomatic aortic stenosis and to be a suitable candidate for TAVR, which was set up for 12/07/18.    Hospital Course     Consultants: none  Severe AS: s/p successful TAVR with a 26 mm Edwards Sapien 3 Ultra THV via the TF approach on 12/07/18. Post operative echo completed but pending formal read. Groin sites are stable. ECG with sinus and no high grade heart block. She was started on Asprin and plavix. Plan for DC home to with close outpatient follow up next week.   Acute on chronic diastolic CHF: as evidenced by  an elevated BNP on pre admission lab work and mild CHF on CXR. This has been treated with TAVR. Will resume home lasix 20mg  daily at discharge.   HTN: BP well controlled. Resume home meds.   CKD stage III: creat has remained stable.   Pulmonary nodule: pre TAVR scan showed a 1.5 x 1.1 cm macrolobulated nodule in the right lower lobe. This is nonspecific, but the possibility of a primary bronchogenic neoplasm or solitary metastasis should be considered. Dr. Cyndia Bent has reviewed this and recommends follow up PET CT and follow up with him in 3 months. I will have this set up in the outpatient setting  _____________  Discharge Vitals Blood pressure 115/61, pulse 61, temperature 99.1 F (37.3 C), temperature source Oral, resp. rate 20, height 5' 1.5" (1.562 m), weight 80.1 kg, SpO2 94 %.  Filed Weights   12/07/18  0911 12/08/18 0600  Weight: 79.6 kg 80.1 kg    Labs & Radiologic Studies    CBC Recent Labs    12/07/18 1439 12/08/18 0337  WBC  --  7.4  HGB 10.5* 10.6*  HCT 31.0* 33.6*  MCV  --  93.3  PLT  --  [id number]   Basic Metabolic Panel Recent Labs    12/07/18 1439 12/08/18 0337  NA 143 141  K 4.7 4.3  CL 109 111  CO2  --  23  GLUCOSE 130* 103*  BUN 23 18  CREATININE 1.10* 1.17*  CALCIUM  --  8.5*  MG  --  2.0   Liver Function Tests No results for input(s): AST, ALT, ALKPHOS, BILITOT, PROT, ALBUMIN in the last 72 hours. No results for input(s): LIPASE, AMYLASE in the last 72 hours. Cardiac Enzymes No results for input(s): CKTOTAL, CKMB, CKMBINDEX, TROPONINI in the last 72 hours. BNP Invalid input(s): POCBNP D-Dimer No results for input(s): DDIMER in the last 72 hours. Hemoglobin A1C No results for input(s): HGBA1C in the last 72 hours. Fasting Lipid Panel No results for input(s): CHOL, HDL, LDLCALC, TRIG, CHOLHDL, LDLDIRECT in the last 72 hours. Thyroid Function Tests No results for input(s): TSH, T4TOTAL, T3FREE, THYROIDAB in the last 72 hours.  Invalid input(s): FREET3 _____________  Dg Chest 2 View  Result Date: 12/03/2018 CLINICAL DATA:  New admission for cardiac surgery. History of hypertension and shortness of breath. EXAM: CHEST - 2 VIEW COMPARISON:  CT chest, 11/29/2018 FINDINGS: Cardiac silhouette is mildly enlarged. No mediastinal or hilar masses. No evidence of adenopathy. Right lower lobe nodule as noted on the current CT. Lungs otherwise clear. No pleural effusion or pneumothorax. Skeletal structures are intact. IMPRESSION: 1. No acute cardiopulmonary disease. 2. Right lower lobe pulmonary nodule as better appreciated the prior CT where it measured 1.1 x 1.5 cm. Please refer to the CT from 11/29/2018 for follow-up recommendations. Electronically Signed   By: Lajean Manes M.D.   On: 12/03/2018 12:31   Ct Coronary Morph W/cta Cor W/score W/ca W/cm &/or  Wo/cm  Addendum Date: 11/30/2018   ADDENDUM REPORT: 11/30/2018 09:59 CLINICAL DATA:  78 year old female with severe aortic stenosis being evaluated for a TAVR procedure. EXAM: Cardiac TAVR CT TECHNIQUE: The patient was scanned on a [employer]. A 120 kV retrospective scan was triggered in the descending thoracic aorta at 111 HU's. Gantry rotation speed was 250 msecs and collimation was .6 mm. No beta blockade or nitro were given. The 3D data set was reconstructed in 5% intervals of the R-R cycle. Systolic and diastolic phases were analyzed on a dedicated  work station using MPR, MIP and VRT modes. The patient received 80 cc of contrast. FINDINGS: Aortic Valve: Trileaflet aortic valve with severe asymmetric calcifications of the non-coronary leaflet but no calcifications extending into the LVOT. Aorta: Ascending aorta is ectatic with maximum diameter 40 mm. There are mild diffuse calcifications and atherosclerotic plaque in the ascending aorta and arch and moderate to severe protruding plaque in the descending aorta. No dissection. Sinotubular Junction: 30 x 30 mm Ascending Thoracic Aorta: 40 x 40 mm Aortic Arch: 26 x 24 mm Descending Thoracic Aorta: 26 x 26 mm Sinus of Valsalva Measurements: Non-coronary: 32 mm Right -coronary: 30 mm Left -coronary: 31 mm Coronary Artery Height above Annulus: Left Main: 8.8 mm Right Coronary: 14 mm Virtual Basal Annulus Measurements: Maximum/Minimum Diameter: 25.4 x 19.7 mm Mean Diameter: 22.3 mm Perimeter: 72.1 mm Area: 391 mm2 Optimum Fluoroscopic Angle for Delivery: LAO 9 CAU 6 IMPRESSION: 1. Trileaflet aortic valve with severe asymmetric calcifications of the non-coronary leaflet and calcifications extending into the LVOT. Aortic valve calcium score 4981. Annular measurements suitable for delivery of a 23 mm Edwards-SAPIEN 3 valve. 2. Sufficient RCA to annulus distance. Borderline LM to annulus distance measuring 9 mm. 3. Optimum Fluoroscopic Angle for Delivery: LAO  9 CAU 6 4. No thrombus in the left atrial appendage. 5. Ascending aorta is ectatic with maximum diameter 40 mm. Electronically Signed   By: Ena Dawley   On: 11/30/2018 09:59   Result Date: 11/30/2018 EXAM: OVER-READ INTERPRETATION  CT CHEST The following report is an over-read performed by radiologist Dr. Vinnie Langton of [facility], [place] on 11/29/2018. This over-read does not include interpretation of cardiac or coronary anatomy or pathology. The coronary calcium score/coronary CTA interpretation by the cardiologist is attached. COMPARISON:  None. FINDINGS: Extracardiac findings will be described separately under dictation for contemporaneously obtained CTA chest, abdomen and pelvis. IMPRESSION: Please see separate dictation for contemporaneously obtained CTA chest, abdomen and pelvis dated 11/29/2018 for full description of relevant extracardiac findings. Electronically Signed: By: Vinnie Langton M.D. On: 11/29/2018 11:49   Dg Chest Port 1 View  Result Date: 12/07/2018 CLINICAL DATA:  Status post TAVR. EXAM: PORTABLE CHEST 1 VIEW COMPARISON:  12/03/2018 FINDINGS: TAVR has been inserted. Heart size and pulmonary vascularity are normal. Aortic atherosclerosis. There is minimal interstitial accentuation likely representing very slight pulmonary edema. No effusions. No pneumothorax. No acute bone abnormality. IMPRESSION: 1. Minimal interstitial edema. 2. Aortic atherosclerosis. 3. No other significant abnormalities. Specifically, no evidence of pneumothorax. Electronically Signed   By: Lorriane Shire M.D.   On: 12/07/2018 17:06   Ct Angio Chest Aorta W &/or Wo Contrast  Result Date: 11/29/2018 CLINICAL DATA:  78 year old female with history of severe aortic valve stenosis. Preprocedural study prior to potential transcatheter aortic valve replacement (TAVR) procedure. EXAM: CT ANGIOGRAPHY CHEST, ABDOMEN AND PELVIS TECHNIQUE: Multidetector CT imaging through the chest, abdomen and pelvis  was performed using the standard protocol during bolus administration of intravenous contrast. Multiplanar reconstructed images and MIPs were obtained and reviewed to evaluate the vascular anatomy. CONTRAST:  157mL OMNIPAQUE IOHEXOL 350 MG/ML SOLN COMPARISON:  None. FINDINGS: CTA CHEST FINDINGS Cardiovascular: Heart size is mildly enlarged. There is no significant pericardial fluid, thickening or pericardial calcification. There is aortic atherosclerosis, as well as atherosclerosis of the great vessels of the mediastinum and the coronary arteries, including calcified atherosclerotic plaque in the left anterior descending, left circumflex and right coronary arteries. Mediastinum/Lymph Nodes: No pathologically enlarged mediastinal or hilar lymph nodes. Esophagus is  unremarkable in appearance. No axillary lymphadenopathy. Lungs/Pleura: 1.5 x 1.1 cm macrolobulated nodule in the right lower lobe (axial image 72 of series 5). No acute consolidative airspace disease. No pleural effusions. Musculoskeletal/Soft Tissues: There are no aggressive appearing lytic or blastic lesions noted in the visualized portions of the skeleton. CTA ABDOMEN AND PELVIS FINDINGS Hepatobiliary: No suspicious cystic or solid hepatic lesions. No intra or extrahepatic biliary ductal dilatation. Gallbladder is normal in appearance. Pancreas: No pancreatic mass. No pancreatic ductal dilatation. No pancreatic or peripancreatic fluid collections or inflammatory changes. Spleen: Unremarkable. Adrenals/Urinary Tract: 8 mm nonobstructive calculus in the lower pole collecting system of the right kidney. Multiple low-attenuation lesions in both kidneys, compatible with simple cysts, largest of which is a 3.7 cm exophytic lesion in the lower pole of the right kidney. Other subcentimeter low-attenuation lesions in both kidneys are too small to definitively characterize, but are favored to represent tiny cysts. No hydroureteronephrosis. Urinary bladder is  normal in appearance. Stomach/Bowel: Normal appearance of the stomach. No pathologic dilatation of small bowel or colon. Numerous colonic diverticulae are noted, particularly in the descending colon and sigmoid colon, without surrounding inflammatory changes to suggest an acute diverticulitis at this time. The appendix is not confidently identified and may be surgically absent. Regardless, there are no inflammatory changes noted adjacent to the cecum to suggest the presence of an acute appendicitis at this time. Vascular/Lymphatic: Aortic atherosclerosis, without evidence of aneurysm or dissection in the abdominal or pelvic vasculature. Vascular findings and measurements pertinent to potential TAVR procedure, as detailed below. No lymphadenopathy noted in the abdomen or pelvis. Reproductive: Uterus and ovaries are trophic. Other: No significant volume of ascites.  No pneumoperitoneum. Musculoskeletal: There are no aggressive appearing lytic or blastic lesions noted in the visualized portions of the skeleton. VASCULAR MEASUREMENTS PERTINENT TO TAVR: AORTA: Minimal Aortic Diameter-14 x 12 mm Severity of Aortic Calcification-moderate to severe RIGHT PELVIS: Right Common Iliac Artery - Minimal Diameter-10.4 x 8.4 mm Tortuosity - mild Calcification-moderate to severe Right External Iliac Artery - Minimal Diameter-6.6 x 7.3 mm Tortuosity - mild-to-moderate Calcification-minimal Right Common Femoral Artery - Minimal Diameter-5.7 x 6.6 mm Tortuosity - mild Calcification-mild LEFT PELVIS: Left Common Iliac Artery - Minimal Diameter-7.3 x 7.1 mm Tortuosity - mild Calcification-moderate to severe Left External Iliac Artery - Minimal Diameter-6.8 x 4.6 mm Tortuosity - mild Calcification-minimal Left Common Femoral Artery - Minimal Diameter-6.5 x 6.0 mm Tortuosity - mild Calcification-mild Review of the MIP images confirms the above findings. IMPRESSION: 1. Vascular findings and measurements pertinent to potential TAVR  procedure, as detailed above. 2. Severe thickening and calcification of the aortic valve, compatible with reported clinical history of severe aortic stenosis. 3. Aortic atherosclerosis, in addition to 3 vessel coronary artery disease. 4. 1.5 x 1.1 cm macrolobulated nodule in the right lower lobe. This is nonspecific, but the possibility of a primary bronchogenic neoplasm or solitary metastasis should be considered. Consider one of the following in 3 months for both low-risk and high-risk individuals: (a) repeat chest CT or (b) follow-up PET-CT. This recommendation follows the consensus statement: Guidelines for Management of Incidental Pulmonary Nodules Detected on CT Images: From the Fleischner Society 2017; Radiology 2017; 284:228-243. 5. 8 mm nonobstructive calculus in the lower pole collecting system of the right kidney. 6. Colonic diverticulosis without evidence of acute diverticulitis at this time. 7. Additional incidental findings, as above. Electronically Signed   By: Vinnie Langton M.D.   On: 11/29/2018 13:54   Vas US Carotid  Result Date:  11/29/2018 Carotid Arterial Duplex Study Indications:       Pre tavr. Risk Factors:      Hypertension. Limitations        Today's exam was limited due to tortuousity. Comparison Study:  no prior Performing Technologist: Abram Sander RVS  Examination Guidelines: A complete evaluation includes B-mode imaging, spectral Doppler, color Doppler, and power Doppler as needed of all accessible portions of each vessel. Bilateral testing is considered an integral part of a complete examination. Limited examinations for reoccurring indications may be performed as noted.  Right Carotid Findings: +----------+--------+--------+--------+------------------+--------+             PSV cm/s EDV cm/s Stenosis Plaque Description Comments  +----------+--------+--------+--------+------------------+--------+  CCA Prox   58       13                heterogenous                  +----------+--------+--------+--------+------------------+--------+  CCA Distal 50       13                heterogenous                 +----------+--------+--------+--------+------------------+--------+  ICA Prox   54       14       1-39%    heterogenous                 +----------+--------+--------+--------+------------------+--------+  ICA Distal 64       18                                             +----------+--------+--------+--------+------------------+--------+  ECA        71       11                                             +----------+--------+--------+--------+------------------+--------+ +----------+--------+-------+--------+-------------------+             PSV cm/s EDV cms Describe Arm Pressure (mmHG)  +----------+--------+-------+--------+-------------------+  Subclavian 99                                             +----------+--------+-------+--------+-------------------+ +---------+--------+--+--------+-+---------+  Vertebral PSV cm/s 42 EDV cm/s 9 Antegrade  +---------+--------+--+--------+-+---------+  Left Carotid Findings: +----------+--------+--------+--------+------------------+--------+             PSV cm/s EDV cm/s Stenosis Plaque Description Comments  +----------+--------+--------+--------+------------------+--------+  CCA Prox   71       17                heterogenous                 +----------+--------+--------+--------+------------------+--------+  CCA Distal 43       15                heterogenous                 +----------+--------+--------+--------+------------------+--------+  ICA Prox   73       20       1-39%    heterogenous  tortuous  +----------+--------+--------+--------+------------------+--------+  ICA Distal 52       18                                   tortuous  +----------+--------+--------+--------+------------------+--------+  ECA        51       8                                              +----------+--------+--------+--------+------------------+--------+  +----------+--------+--------+--------+-------------------+             PSV cm/s EDV cm/s Describe Arm Pressure (mmHG)  +----------+--------+--------+--------+-------------------+  Subclavian 55                                              +----------+--------+--------+--------+-------------------+ +---------+--------+--+--------+--+---------+  Vertebral PSV cm/s 47 EDV cm/s 10 Antegrade  +---------+--------+--+--------+--+---------+  Summary: Right Carotid: Velocities in the right ICA are consistent with a 1-39% stenosis. Left Carotid: Velocities in the left ICA are consistent with a 1-39% stenosis. Vertebrals: Bilateral vertebral arteries demonstrate antegrade flow. *See table(s) above for measurements and observations.  Electronically signed by Monica Martinez MD on 11/29/2018 at 5:13:49 PM.    Final    Ct Angio Abd/pel W/ And/or W/o  Result Date: 11/29/2018 CLINICAL DATA:  78 year old female with history of severe aortic valve stenosis. Preprocedural study prior to potential transcatheter aortic valve replacement (TAVR) procedure. EXAM: CT ANGIOGRAPHY CHEST, ABDOMEN AND PELVIS TECHNIQUE: Multidetector CT imaging through the chest, abdomen and pelvis was performed using the standard protocol during bolus administration of intravenous contrast. Multiplanar reconstructed images and MIPs were obtained and reviewed to evaluate the vascular anatomy. CONTRAST:  167mL OMNIPAQUE IOHEXOL 350 MG/ML SOLN COMPARISON:  None. FINDINGS: CTA CHEST FINDINGS Cardiovascular: Heart size is mildly enlarged. There is no significant pericardial fluid, thickening or pericardial calcification. There is aortic atherosclerosis, as well as atherosclerosis of the great vessels of the mediastinum and the coronary arteries, including calcified atherosclerotic plaque in the left anterior descending, left circumflex and right coronary arteries. Mediastinum/Lymph Nodes: No pathologically enlarged mediastinal or hilar lymph nodes. Esophagus  is unremarkable in appearance. No axillary lymphadenopathy. Lungs/Pleura: 1.5 x 1.1 cm macrolobulated nodule in the right lower lobe (axial image 72 of series 5). No acute consolidative airspace disease. No pleural effusions. Musculoskeletal/Soft Tissues: There are no aggressive appearing lytic or blastic lesions noted in the visualized portions of the skeleton. CTA ABDOMEN AND PELVIS FINDINGS Hepatobiliary: No suspicious cystic or solid hepatic lesions. No intra or extrahepatic biliary ductal dilatation. Gallbladder is normal in appearance. Pancreas: No pancreatic mass. No pancreatic ductal dilatation. No pancreatic or peripancreatic fluid collections or inflammatory changes. Spleen: Unremarkable. Adrenals/Urinary Tract: 8 mm nonobstructive calculus in the lower pole collecting system of the right kidney. Multiple low-attenuation lesions in both kidneys, compatible with simple cysts, largest of which is a 3.7 cm exophytic lesion in the lower pole of the right kidney. Other subcentimeter low-attenuation lesions in both kidneys are too small to definitively characterize, but are favored to represent tiny cysts. No hydroureteronephrosis. Urinary bladder is normal in appearance. Stomach/Bowel: Normal appearance of the stomach. No pathologic dilatation of small bowel or colon. Numerous colonic diverticulae are noted, particularly  in the descending colon and sigmoid colon, without surrounding inflammatory changes to suggest an acute diverticulitis at this time. The appendix is not confidently identified and may be surgically absent. Regardless, there are no inflammatory changes noted adjacent to the cecum to suggest the presence of an acute appendicitis at this time. Vascular/Lymphatic: Aortic atherosclerosis, without evidence of aneurysm or dissection in the abdominal or pelvic vasculature. Vascular findings and measurements pertinent to potential TAVR procedure, as detailed below. No lymphadenopathy noted in the  abdomen or pelvis. Reproductive: Uterus and ovaries are trophic. Other: No significant volume of ascites.  No pneumoperitoneum. Musculoskeletal: There are no aggressive appearing lytic or blastic lesions noted in the visualized portions of the skeleton. VASCULAR MEASUREMENTS PERTINENT TO TAVR: AORTA: Minimal Aortic Diameter-14 x 12 mm Severity of Aortic Calcification-moderate to severe RIGHT PELVIS: Right Common Iliac Artery - Minimal Diameter-10.4 x 8.4 mm Tortuosity - mild Calcification-moderate to severe Right External Iliac Artery - Minimal Diameter-6.6 x 7.3 mm Tortuosity - mild-to-moderate Calcification-minimal Right Common Femoral Artery - Minimal Diameter-5.7 x 6.6 mm Tortuosity - mild Calcification-mild LEFT PELVIS: Left Common Iliac Artery - Minimal Diameter-7.3 x 7.1 mm Tortuosity - mild Calcification-moderate to severe Left External Iliac Artery - Minimal Diameter-6.8 x 4.6 mm Tortuosity - mild Calcification-minimal Left Common Femoral Artery - Minimal Diameter-6.5 x 6.0 mm Tortuosity - mild Calcification-mild Review of the MIP images confirms the above findings. IMPRESSION: 1. Vascular findings and measurements pertinent to potential TAVR procedure, as detailed above. 2. Severe thickening and calcification of the aortic valve, compatible with reported clinical history of severe aortic stenosis. 3. Aortic atherosclerosis, in addition to 3 vessel coronary artery disease. 4. 1.5 x 1.1 cm macrolobulated nodule in the right lower lobe. This is nonspecific, but the possibility of a primary bronchogenic neoplasm or solitary metastasis should be considered. Consider one of the following in 3 months for both low-risk and high-risk individuals: (a) repeat chest CT or (b) follow-up PET-CT. This recommendation follows the consensus statement: Guidelines for Management of Incidental Pulmonary Nodules Detected on CT Images: From the Fleischner Society 2017; Radiology 2017; 284:228-243. 5. 8 mm nonobstructive calculus  in the lower pole collecting system of the right kidney. 6. Colonic diverticulosis without evidence of acute diverticulitis at this time. 7. Additional incidental findings, as above. Electronically Signed   By: Vinnie Langton M.D.   On: 11/29/2018 13:54   Disposition   Pt is being discharged home today in good condition.  Follow-up Plans & Appointments    Follow-up Information    Eileen Stanford, PA-C. Go on 12/15/2018.   Specialties: Cardiology, Radiology Why: @ 2pm, please arrive at least 10 minutes early.  Contact information: [address] [place] [address] [phone]            Discharge Medications   Allergies as of 12/08/2018   No Known Allergies     Medication List    TAKE these medications   aspirin 81 MG chewable tablet Chew 1 tablet (81 mg total) by mouth daily. Start taking on: December 09, 2018   CALCIUM+D3 PO Take 1 tablet by mouth daily.   clopidogrel 75 MG tablet Commonly known as: PLAVIX Take 1 tablet (75 mg total) by mouth daily with breakfast. Start taking on: December 09, 2018   furosemide 20 MG tablet Commonly known as: LASIX Take 1 tablet (20 mg total) by mouth daily.   lisinopril 40 MG tablet Commonly known as: ZESTRIL Take 40 mg by mouth daily.  rosuvastatin 20 MG tablet Commonly known as: CRESTOR Take 20 mg by mouth every evening.           Outstanding Labs/Studies   none  Duration of Discharge Encounter   Greater than 30 minutes including physician time.  Mable Fill, PA-C 12/08/2018, 10:25 AM [phone]

## 2018-12-08 NOTE — Progress Notes (Signed)
  Echocardiogram 2D Echocardiogram has been performed.   POST TAVR EVALUATION    Emily Trujillo L Androw 12/08/2018, 8:56 AM

## 2018-12-08 NOTE — Progress Notes (Signed)
D/C instructions given to patient. Medications and wound care reviewed. All questions answered. IV's removed, clean and intact. Daughter-in-law to escort pt home.  Clyde Canterbury, RN

## 2018-12-08 NOTE — Progress Notes (Signed)
Pt ambulated 700 feet last night and this am without any assistive device, tolerated well.  Bilateral groin site level zero. Bilateral pedal pulses palpable. Left groin dressing removed, band aid applied. Will continue to monitor.

## 2018-12-09 ENCOUNTER — Telehealth: Payer: Self-pay | Admitting: Physician Assistant

## 2018-12-09 LAB — TYPE AND SCREEN
ABO/RH(D): A NEG
Antibody Screen: POSITIVE
Unit division: 0
Unit division: 0

## 2018-12-09 LAB — BPAM RBC
Blood Product Expiration Date: 202011022359
Blood Product Expiration Date: 202011062359
Unit Type and Rh: 600
Unit Type and Rh: 600

## 2018-12-09 NOTE — Telephone Encounter (Signed)
  [place] VALVE TEAM   Patient contacted regarding discharge from [facility] on 12/08/18  Patient understands to follow up with provider Nell Range on 10/28 @2pm  at [place].  Patient understands discharge instructions? yes Patient understands medications and regimen? yes Patient understands to bring all medications to this visit? yes   Angelena Form PA-C  [facility]

## 2018-12-15 ENCOUNTER — Encounter: Payer: Self-pay | Admitting: Physician Assistant

## 2018-12-15 ENCOUNTER — Other Ambulatory Visit: Payer: Self-pay

## 2018-12-15 ENCOUNTER — Telehealth: Payer: Self-pay | Admitting: Cardiovascular Disease

## 2018-12-15 ENCOUNTER — Ambulatory Visit (INDEPENDENT_AMBULATORY_CARE_PROVIDER_SITE_OTHER): Payer: Medicare Other | Admitting: Physician Assistant

## 2018-12-15 ENCOUNTER — Other Ambulatory Visit: Payer: Self-pay | Admitting: Physician Assistant

## 2018-12-15 VITALS — BP 134/70 | HR 62 | Ht 61.0 in | Wt 175.4 lb

## 2018-12-15 DIAGNOSIS — R911 Solitary pulmonary nodule: Secondary | ICD-10-CM

## 2018-12-15 DIAGNOSIS — Z952 Presence of prosthetic heart valve: Secondary | ICD-10-CM | POA: Diagnosis not present

## 2018-12-15 DIAGNOSIS — I10 Essential (primary) hypertension: Secondary | ICD-10-CM

## 2018-12-15 DIAGNOSIS — I5032 Chronic diastolic (congestive) heart failure: Secondary | ICD-10-CM | POA: Diagnosis not present

## 2018-12-15 NOTE — Patient Instructions (Addendum)
Medication Instructions:  Your physician has recommended you make the following change in your medication:  1-STOP Plavix on June 07, 2019 or when your pills run out.  *If you need a refill on your cardiac medications before your next appointment, please call your pharmacy*  Lab Work:  If you have labs (blood work) drawn today and your tests are completely normal, you will receive your results only by: Marland Kitchen MyChart Message (if you have MyChart) OR . A paper copy in the mail If you have any lab test that is abnormal or we need to change your treatment, we will call you to review the results.  Testing/Procedures: Your physician has requested that you have a PET scan done in 3 months on a Wednesday mid-morning.  Follow-Up: At [facility], you and your health needs are our priority.  As part of our continuing mission to provide you with exceptional heart care, we have created designated Provider Care Teams.  These Care Teams include your primary Cardiologist (physician) and Advanced Practice Providers (APPs -  Physician Assistants and Nurse Practitioners) who all work together to provide you with the care you need, when you need it.  Your next appointment:   01/07/19  The format for your next appointment:   In Person  Provider:   Dr. Robb Matar

## 2018-12-15 NOTE — Telephone Encounter (Signed)
Gave message to Nell Range who called Anne Ng during the visit.

## 2018-12-15 NOTE — Telephone Encounter (Signed)
New Message  Patient's daughter-in-law Anne Ng) is calling in to see if she can get someone to discuss the appointment with the patient with her since she was not allowed to come in. Please give daughter-in- law a call back to discuss.

## 2018-12-15 NOTE — Progress Notes (Signed)
[facility] [place]                                       Cardiology Office Note    Date:  12/15/2018   ID:  Emily Trujillo, DOB [birth year], MRN [id number]  PCP:  Glenda Chroman, MD  Cardiologist: Dr. Virgina Jock / Dr. Angelena Form & Dr. Cyndia Bent (TAVR)  CC: [facility] s/p TAVR  History of Present Illness:  Emily Trujillo is a 78 y.o. female with a history of HTN, HLD, CKD stage III, previous heavy tobacco abuse, chronic diastolic CHF with recent CHF admission and severe AS s/p TAVR (12/07/18) who presents to clinic for follow up.   She was recently admitted in 09/2018 in [place], [place] with progressive shortness of breath. She was treated for pneumonia and improved with diuresis. She was seen by Dr. Etheleen Sia month and had a 2D echocardiogram showing critical aortic stenosis with a mean gradient of 83 mmHg and a peak gradient of 101 mmHg. Aortic valve area was 0.75 cm. Left ventricular ejection fraction was 50 to 55%. There was felt to be at least moderate insufficiency. There is mild mitral regurgitation. She underwent cardiac catheterization on 11/23/2018 which showed mild nonobstructive coronary disease. During her work up, pre TAVR CT showed a 1.5 x 1.1 cm macrolobulated nodule in the right lower lobe. This is nonspecific, but the possibility of a primary bronchogenic neoplasm or solitary metastasis should be considered. Dr. Cyndia Bent has reviewed this and recommends follow up PET CT and follow up with him in 3 months.  She was evaluated by the multidisciplinary valve team and underwent successful TAVR with a 26 mm Edwards Sapien 3 Ultra THV via the TF approach on 12/07/18. Post operative echo showed EF 55-60% with normally functioning TAVR with an elevated mean gradient of 19.8 mm Hg and no PVL. Mean gradient is a little more elevated than expected. She had an uncomplicated hospital course and was discharged on POD1 one aspirin and plavix.    Today she presents to clinic for follow up.   Past Medical History:  Diagnosis Date  . Chronic diastolic heart failure ([place])   . CKD (chronic kidney disease), stage III   . Dyslipidemia   . History of kidney stones   . Hypertension   . Pulmonary nodule    1.5 x 1.1 cm macrolobulated nodule in the right lower lobe, noted on pre TAVR CT. Will require close follow up   . S/P TAVR (transcatheter aortic valve replacement)    s/p Edwards S3U via the TF approach  . Severe aortic stenosis 11/12/2018  . Tobacco abuse     Past Surgical History:  Procedure Laterality Date  . APPENDECTOMY    . CYST EXCISION Left    Left arm cyst  . EYE SURGERY Bilateral   . RIGHT HEART CATH AND CORONARY ANGIOGRAPHY N/A 11/23/2018   Procedure: RIGHT HEART CATH AND CORONARY ANGIOGRAPHY;  Surgeon: Nigel Mormon, MD;  Location: [facility];  Service: Cardiovascular;  Laterality: N/A;  . TEE WITHOUT CARDIOVERSION N/A 12/07/2018   Procedure: TRANSESOPHAGEAL ECHOCARDIOGRAM (TEE);  Surgeon: Burnell Blanks, MD;  Location: [facility];  Service: Open Heart Surgery;  Laterality: N/A;  . TONSILLECTOMY    . TRANSCATHETER AORTIC VALVE REPLACEMENT, TRANSFEMORAL N/A 12/07/2018   Procedure: TRANSCATHETER AORTIC VALVE REPLACEMENT, TRANSFEMORAL;  Surgeon: Burnell Blanks, MD;  Location:  [place] [facility];  Service: Open Heart Surgery;  Laterality: N/A;    Current Medications: Outpatient Medications Prior to Visit  Medication Sig Dispense Refill  . aspirin 81 MG chewable tablet Chew 1 tablet (81 mg total) by mouth daily. 90 tablet 3  . Calcium Carb-Cholecalciferol (CALCIUM+D3 PO) Take 1 tablet by mouth daily.    . clopidogrel (PLAVIX) 75 MG tablet Take 1 tablet (75 mg total) by mouth daily with breakfast. 90 tablet 1  . furosemide (LASIX) 20 MG tablet Take 1 tablet (20 mg total) by mouth daily. 90 tablet 3  . lisinopril (ZESTRIL) 40 MG tablet Take 40 mg by mouth daily.    . rosuvastatin (CRESTOR) 20 MG tablet  Take 20 mg by mouth every evening.      No facility-administered medications prior to visit.      Allergies:   Patient has no known allergies.   Social History   Socioeconomic History  . Marital status: Divorced    Spouse name: Not on file  . Number of children: 2  . Years of education: Not on file  . Highest education level: Not on file  Occupational History  . Occupation: [employer]  . Financial resource strain: Not on file  . Food insecurity    Worry: Not on file    Inability: Not on file  . Transportation needs    Medical: Not on file    Non-medical: Not on file  Tobacco Use  . Smoking status: Former Smoker    Packs/day: 0.50    Years: 60.00    Pack years: 30.00    Types: Cigarettes    Quit date: 09/2018    Years since quitting: 0.2  . Smokeless tobacco: Never Used  Substance and Sexual Activity  . Alcohol use: Not Currently  . Drug use: Not on file  . Sexual activity: Not on file  Lifestyle  . Physical activity    Days per week: Not on file    Minutes per session: Not on file  . Stress: Not on file  Relationships  . Social Herbalist on phone: Not on file    Gets together: Not on file    Attends religious service: Not on file    Active member of club or organization: Not on file    Attends meetings of clubs or organizations: Not on file    Relationship status: Not on file  Other Topics Concern  . Not on file  Social History Narrative  . Not on file     Family History:  The patient's family history includes Cancer in her sister; Heart attack in her father.     ROS:   Please see the history of present illness.    ROS All other systems reviewed and are negative.   PHYSICAL EXAM:   VS:  BP 134/70   Pulse 62   Ht 5\' 1"  (1.549 m)   Wt 175 lb 6.4 oz (79.6 kg)   LMP  (LMP Unknown)   SpO2 96%   BMI 33.14 kg/m    GEN: Well nourished, well developed, in no acute distress HEENT: normal Neck: no JVD or masses Cardiac:  RRR; 2/6 SEM @ RUSB. No rubs, or gallops,no edema  Respiratory:  clear to auscultation bilaterally, normal work of breathing GI: soft, nontender, nondistended, + BS MS: no deformity or atrophy Skin: warm and dry, no rash.  Groin sites clear without hematoma or ecchymosis  Neuro:  Alert and Oriented  x 3, Strength and sensation are intact Psych: euthymic mood, full affect   Wt Readings from Last 3 Encounters:  12/15/18 175 lb 6.4 oz (79.6 kg)  12/08/18 176 lb 9.4 oz (80.1 kg)  12/03/18 175 lb 8 oz (79.6 kg)      Studies/Labs Reviewed:   EKG:  EKG is ordered today.  The ekg ordered today demonstrates sinus with PACs HR 68 bpm, LVH with repol abnormality.   Recent Labs: 12/03/2018: ALT 14; B Natriuretic Peptide 354.1 12/08/2018: BUN 18; Creatinine, Ser 1.17; Hemoglobin 10.6; Magnesium 2.0; Platelets 166; Potassium 4.3; Sodium 141   Lipid Panel No results found for: CHOL, TRIG, HDL, CHOLHDL, VLDL, LDLCALC, LDLDIRECT  Additional studies/ records that were reviewed today include:  TAVR OPERATIVE NOTE   Date of Procedure:12/07/2018  Preoperative Diagnosis:Severe Aortic Stenosis   Postoperative Diagnosis:Same   Procedure:   Transcatheter Aortic Valve Replacement - PercutaneousRightTransfemoral Approach Edwards Sapien 3 Ultra THV (size 54mm, model # [id number], serial # [id number])  Co-Surgeons:Bryan Alveria Apley, MD and Lauree Chandler, MD   Anesthesiologist:S. Gifford Shave, MD  Echocardiographer:P. Johnsie Cancel, MD  Pre-operative Echo Findings: ? Severe aortic stenosis ? Normalleft ventricular systolic function  Post-operative Echo Findings: ? Noparavalvular leak ? Normalleft ventricular systolic function  _____________   Echo 12/08/18: IMPRESSIONS  1. Left ventricular ejection fraction, by visual estimation, is 55 to 60%. The left ventricle has normal  function. Normal left ventricular size. Left ventricular septal wall thickness was normal. Normal left ventricular posterior wall thickness. There  is no left ventricular hypertrophy.  2. Left ventricular diastolic Doppler parameters are consistent with pseudonormalization pattern of LV diastolic filling.  3. Global right ventricle has normal systolic function.The right ventricular size is normal. No increase in right ventricular wall thickness.  4. Left atrial size was moderately dilated.  5. Right atrial size was severely dilated.  6. Presence of pericardial fat pad.  7. Trivial pericardial effusion is present.  8. Mild to moderate mitral annular calcification.  9. The mitral valve is normal in structure. Trace mitral valve regurgitation. 10. The tricuspid valve is normal in structure. Tricuspid valve regurgitation is trivial. 11. Aortic valve regurgitation was not visualized by color flow Doppler. 12. The pulmonic valve was grossly normal. Pulmonic valve regurgitation is trivial by color flow Doppler. 13. TR signal is inadequate for assessing pulmonary artery systolic pressure. 14. The inferior vena cava is dilated in size with >50% respiratory variability, suggesting right atrial pressure of 8 mmHg.  ASSESSMENT & PLAN:   Severe AS s/p TAVR:groin sites healing well. ECG with no HAVB. SBE prophylaxis discussed; the patient is edentulous and does not go to the dentist. Continue on aspirin and plavix x 6 months then discontinue plavix (05/2019).  POD1 echo reported higher than expected gradients with a 26 mm valve (mean gradient ~20 mm Hg). She is clinically doing well. Will follow gradients on 1 month echo. Dr. Virgina Jock will be seeing her for this visit as I will be out of town.   Chronic diastolic CHF: appear euvolemic. Continue lasix 20mg  daily.   HTN: BP in good range today.  Pulmonary nodule: pre TAVR scan showed a 1.5 x 1.1 cm macrolobulated nodule in the right lower lobe. This is  nonspecific, but the possibility of a primary bronchogenic neoplasm or solitary metastasis should be considered. Dr. Cyndia Bent has reviewed this and recommends follow up PET CT and follow up with him in 3 months. I will have this set up today   Medication Adjustments/Labs and Tests Ordered:  Current medicines are reviewed at length with the patient today.  Concerns regarding medicines are outlined above.  Medication changes, Labs and Tests ordered today are listed in the Patient Instructions below. Patient Instructions  Medication Instructions:  Your physician has recommended you make the following change in your medication:  1-STOP Plavix on June 07, 2019 or when your pills run out.  *If you need a refill on your cardiac medications before your next appointment, please call your pharmacy*  Lab Work:  If you have labs (blood work) drawn today and your tests are completely normal, you will receive your results only by: Marland Kitchen MyChart Message (if you have MyChart) OR . A paper copy in the mail If you have any lab test that is abnormal or we need to change your treatment, we will call you to review the results.  Testing/Procedures: Your physician has requested that you have a PET scan done in 3 months on a Wednesday mid-morning.  Follow-Up: At [facility], you and your health needs are our priority.  As part of our continuing mission to provide you with exceptional heart care, we have created designated Provider Care Teams.  These Care Teams include your primary Cardiologist (physician) and Advanced Practice Providers (APPs -  Physician Assistants and Nurse Practitioners) who all work together to provide you with the care you need, when you need it.  Your next appointment:   01/07/19  The format for your next appointment:   In Person  Provider:   Dr. Robb Matar      Signed, Angelena Form, PA-C  12/15/2018 3:12 PM    [address], [place],    [address] Phone: [phone]; Fax: [phone]

## 2019-01-07 ENCOUNTER — Ambulatory Visit (INDEPENDENT_AMBULATORY_CARE_PROVIDER_SITE_OTHER): Payer: Medicare Other | Admitting: Cardiology

## 2019-01-07 ENCOUNTER — Encounter: Payer: Self-pay | Admitting: Cardiology

## 2019-01-07 ENCOUNTER — Other Ambulatory Visit: Payer: Medicare Other

## 2019-01-07 ENCOUNTER — Ambulatory Visit (INDEPENDENT_AMBULATORY_CARE_PROVIDER_SITE_OTHER): Payer: Medicare Other

## 2019-01-07 ENCOUNTER — Other Ambulatory Visit: Payer: Self-pay

## 2019-01-07 VITALS — BP 137/65 | HR 63 | Temp 98.0°F | Ht 61.0 in | Wt 175.0 lb

## 2019-01-07 DIAGNOSIS — I350 Nonrheumatic aortic (valve) stenosis: Secondary | ICD-10-CM

## 2019-01-07 DIAGNOSIS — I10 Essential (primary) hypertension: Secondary | ICD-10-CM

## 2019-01-07 DIAGNOSIS — Z952 Presence of prosthetic heart valve: Secondary | ICD-10-CM | POA: Diagnosis not present

## 2019-01-07 NOTE — Progress Notes (Addendum)
Patient referred by Glenda Chroman, MD for severer aortic stenosis, moderate AR.  Subjective:   Emily Trujillo, female    DOB: 09/02/1940, 78 y.o.   MRN: [id number]   Chief Complaint  Patient presents with  . TAVR  . Hypertension  . Follow-up    1 month     HPI  78 y.o. Caucasian female with hypertension, hyperlipidemia, h/o tobacco abuse, critical aortic stenosis, Now s/p Edwards Sapien 3 THV.  Patient is doing well and is able to walk up and down stairs with no significant exertional dyspnea.  She denies any chest pain.  She has quit smoking.  Blood pressure is well controlled.  Echocardiogram from today reviewed with the patient.  Past Medical History:  Diagnosis Date  . Chronic diastolic heart failure ([place])   . CKD (chronic kidney disease), stage III   . Dyslipidemia   . History of kidney stones   . Hypertension   . Pulmonary nodule    1.5 x 1.1 cm macrolobulated nodule in the right lower lobe, noted on pre TAVR CT. Will require close follow up   . S/P TAVR (transcatheter aortic valve replacement)    s/p Edwards S3U via the TF approach  . Severe aortic stenosis 11/12/2018  . Tobacco abuse     Past Surgical History:  Procedure Laterality Date  . APPENDECTOMY    . CYST EXCISION Left    Left arm cyst  . EYE SURGERY Bilateral   . RIGHT HEART CATH AND CORONARY ANGIOGRAPHY N/A 11/23/2018   Procedure: RIGHT HEART CATH AND CORONARY ANGIOGRAPHY;  Surgeon: Nigel Mormon, MD;  Location: [facility];  Service: Cardiovascular;  Laterality: N/A;  . TEE WITHOUT CARDIOVERSION N/A 12/07/2018   Procedure: TRANSESOPHAGEAL ECHOCARDIOGRAM (TEE);  Surgeon: Burnell Blanks, MD;  Location: [facility];  Service: Open Heart Surgery;  Laterality: N/A;  . TONSILLECTOMY    . TRANSCATHETER AORTIC VALVE REPLACEMENT, TRANSFEMORAL N/A 12/07/2018   Procedure: TRANSCATHETER AORTIC VALVE REPLACEMENT, TRANSFEMORAL;  Surgeon: Burnell Blanks, MD;  Location: [facility];  Service:  Open Heart Surgery;  Laterality: N/A;     Social History   Socioeconomic History  . Marital status: Divorced    Spouse name: Not on file  . Number of children: 2  . Years of education: Not on file  . Highest education level: Not on file  Occupational History  . Occupation: [employer]  . Financial resource strain: Not on file  . Food insecurity    Worry: Not on file    Inability: Not on file  . Transportation needs    Medical: Not on file    Non-medical: Not on file  Tobacco Use  . Smoking status: Former Smoker    Packs/day: 0.50    Years: 60.00    Pack years: 30.00    Types: Cigarettes    Quit date: 09/2018    Years since quitting: 0.3  . Smokeless tobacco: Never Used  Substance and Sexual Activity  . Alcohol use: Not Currently  . Drug use: Not on file  . Sexual activity: Not on file  Lifestyle  . Physical activity    Days per week: Not on file    Minutes per session: Not on file  . Stress: Not on file  Relationships  . Social Herbalist on phone: Not on file    Gets together: Not on file    Attends religious service: Not on file  Active member of club or organization: Not on file    Attends meetings of clubs or organizations: Not on file    Relationship status: Not on file  . Intimate partner violence    Fear of current or ex partner: Not on file    Emotionally abused: Not on file    Physically abused: Not on file    Forced sexual activity: Not on file  Other Topics Concern  . Not on file  Social History Narrative  . Not on file     Family History  Problem Relation Age of Onset  . Heart attack Father   . Cancer Sister      Current Outpatient Medications on File Prior to Visit  Medication Sig Dispense Refill  . aspirin 81 MG chewable tablet Chew 1 tablet (81 mg total) by mouth daily. 90 tablet 3  . Calcium Carb-Cholecalciferol (CALCIUM+D3 PO) Take 1 tablet by mouth daily.    . clopidogrel (PLAVIX) 75 MG tablet Take  1 tablet (75 mg total) by mouth daily with breakfast. 90 tablet 1  . furosemide (LASIX) 20 MG tablet Take 1 tablet (20 mg total) by mouth daily. 90 tablet 3  . lisinopril (ZESTRIL) 40 MG tablet Take 40 mg by mouth daily.    . rosuvastatin (CRESTOR) 20 MG tablet Take 20 mg by mouth every evening.     . clobetasol ointment (TEMOVATE) 3.42 % Apply 1 application topically daily as needed.     No current facility-administered medications on file prior to visit.     Cardiovascular studies:  EKG 01/07/2019: Sinus rhythm 59 bpm. Left ventricular hypertrophy. IVCD. Occasional PAC     Echocardiogram 01/07/2019: Left ventricle cavity is normal in size. Mild concentric hypertrophy of the left ventricle. Normal LV systolic function with EF 56%. Normal global wall motion. Doppler evidence of grade I (impaired) diastolic dysfunction, normal LAP.  Normally functioning Edwards Sapien 3 THV in place. Mean PG 12 mmHg, which is normal. No paravalvular leak.  Trace mitral and tricuspid regurgitation. Estimated PASP 26 mmHg.  No significant change compared to post TAVR study on 12/08/2018.  EKG 11/12/2018: Sinus rhythm 54 bpm. Occasional PAC.    Left axis -anterior fascicular block.  Left ventricular hypertrophy (strain).  Nonspecific ST depression, likely due to LVH.  Echocardiogram 10/06/2018: Normal LV size. Mod LVH. EF 55-60%.  Critical AS. Mean PG 66 mmHg. Peak vel 5.1 m/sec. AVA 0.49 cm2. Mild AI. Mild MV calcification. Mild to mod MR.  Mild TR. PASP 28 mmHg.  >50% IVC collapse.  Recent labs: 09/2018: Glucose 121, BUN/Cr 20/1.05. eGFR 51. Na/K 140/3.8. Rest of the CMP normal. H/H 11.4/36/4. MCV 96. Platelets 202.  Trop 0.05-0.07. BNP 798.   Review of Systems  Constitution: Negative for decreased appetite, malaise/fatigue, weight gain and weight loss.  HENT: Negative for congestion.   Eyes: Negative for visual disturbance.  Cardiovascular: Positive for dyspnea on exertion. Negative for  chest pain, leg swelling, palpitations and syncope.  Respiratory: Negative for cough.   Endocrine: Negative for cold intolerance.  Hematologic/Lymphatic: Does not bruise/bleed easily.  Skin: Negative for itching and rash.  Musculoskeletal: Negative for myalgias.  Gastrointestinal: Negative for abdominal pain, nausea and vomiting.  Genitourinary: Negative for dysuria.  Neurological: Negative for dizziness and weakness.  Psychiatric/Behavioral: The patient is not nervous/anxious.   All other systems reviewed and are negative.        Vitals:   01/07/19 1122 01/07/19 1129  BP: (!) 144/57 137/65  Pulse: 63  Temp: 98 F (36.7 C)   SpO2: 95%      Body mass index is 33.07 kg/m. Filed Weights   01/07/19 1122  Weight: 175 lb (79.4 kg)     Objective:   Physical Exam  Constitutional: She is oriented to person, place, and time. She appears well-developed and well-nourished. No distress.  HENT:  Head: Normocephalic and atraumatic.  Eyes: Pupils are equal, round, and reactive to light. Conjunctivae are normal.  Neck: No JVD present.  Cardiovascular: Normal rate, regular rhythm and intact distal pulses.  Murmur heard.  Harsh crescendo-decrescendo midsystolic murmur is present with a grade of 4/6 at the upper right sternal border radiating to the neck. Pulmonary/Chest: Effort normal and breath sounds normal. She has no wheezes. She has no rales.  Abdominal: Soft. Bowel sounds are normal. There is no rebound.  Musculoskeletal:        General: Edema (Trace b/l) present.  Lymphadenopathy:    She has no cervical adenopathy.  Neurological: She is alert and oriented to person, place, and time. No cranial nerve deficit.  Skin: Skin is warm and dry.  Psychiatric: She has a normal mood and affect.  Nursing note and vitals reviewed.         Assessment & Recommendations:   78 y.o. Caucasian female with hypertension, hyperlipidemia, stage D critical aortic stenosis, now s/p  Edwards  Sapien 3 THV  Severe aortic stenosis, now s/p Edwards Sapien 2 THV: Normal functioning valve with expected mean gradient of 12 mmHg.  No paravalvular leak. Will refer to cardiac rehab, although patient is unsure if this is feasible for her, given that she lives in [place].  Continue aspirin, Plavix for 6 months. NYHA class I symptoms.  Mild nonobstructive CAD: Continue medical management with aspirin, statin, and blood pressure control.  I congratulated the patient on quitting smoking.  She has regular follow-up with her PCP Dr. Woody Seller.  I will see her on as-needed basis.  Nigel Mormon, MD [facility]. [facility] Pager: [phone] Office: [phone] If no answer Cell [phone]

## 2019-01-11 ENCOUNTER — Telehealth (HOSPITAL_COMMUNITY): Payer: Self-pay | Admitting: *Deleted

## 2019-01-11 DIAGNOSIS — Z7189 Other specified counseling: Secondary | ICD-10-CM | POA: Diagnosis not present

## 2019-01-11 DIAGNOSIS — I10 Essential (primary) hypertension: Secondary | ICD-10-CM | POA: Diagnosis not present

## 2019-01-11 DIAGNOSIS — Z1211 Encounter for screening for malignant neoplasm of colon: Secondary | ICD-10-CM | POA: Diagnosis not present

## 2019-01-11 DIAGNOSIS — Z1339 Encounter for screening examination for other mental health and behavioral disorders: Secondary | ICD-10-CM | POA: Diagnosis not present

## 2019-01-11 DIAGNOSIS — R5383 Other fatigue: Secondary | ICD-10-CM | POA: Diagnosis not present

## 2019-01-11 DIAGNOSIS — Z1331 Encounter for screening for depression: Secondary | ICD-10-CM | POA: Diagnosis not present

## 2019-01-11 DIAGNOSIS — Z299 Encounter for prophylactic measures, unspecified: Secondary | ICD-10-CM | POA: Diagnosis not present

## 2019-01-11 DIAGNOSIS — Z6832 Body mass index (BMI) 32.0-32.9, adult: Secondary | ICD-10-CM | POA: Diagnosis not present

## 2019-01-11 DIAGNOSIS — I5033 Acute on chronic diastolic (congestive) heart failure: Secondary | ICD-10-CM | POA: Diagnosis not present

## 2019-01-11 DIAGNOSIS — Z79899 Other long term (current) drug therapy: Secondary | ICD-10-CM | POA: Diagnosis not present

## 2019-01-11 DIAGNOSIS — Z0000 Encounter for general adult medical examination without abnormal findings: Secondary | ICD-10-CM | POA: Diagnosis not present

## 2019-01-11 DIAGNOSIS — E7800 Pure hypercholesterolemia, unspecified: Secondary | ICD-10-CM | POA: Diagnosis not present

## 2019-01-11 NOTE — Telephone Encounter (Signed)
Left message requesting call back from pt regarding Cardiac Referral from Dr. Virgina Jock.  Asked pt for call back to discuss options for Cardiac Rehab.  Pt lives in [place] and there is a cardiac rehab program at the [facility]. However if she wishes to come to [facility] we can proceed with insurance verification and scheduling. Cherre Huger, BSN Cardiac and Training and development officer

## 2019-01-11 NOTE — Telephone Encounter (Signed)
Received call back from pt.  Pt advised of her option for Cardiac Rehab.  Participating at [facility] or [facility].  Pt indicated that she could not drive to [place] three times a week and would like to go to the [facility] [employer].  Will contact Dr. Virgina Jock office and leave message for [place], referral coordinator regarding pt wishes.  Pt given the number for the [facility] program.  Pt instructed to call them if she had not heard from them in about a week. Pt verbalized understanding.  Cherre Huger, BSN Cardiac and Training and development officer

## 2019-01-18 DIAGNOSIS — M2041 Other hammer toe(s) (acquired), right foot: Secondary | ICD-10-CM | POA: Diagnosis not present

## 2019-01-18 DIAGNOSIS — L723 Sebaceous cyst: Secondary | ICD-10-CM | POA: Diagnosis not present

## 2019-01-18 DIAGNOSIS — M79674 Pain in right toe(s): Secondary | ICD-10-CM | POA: Diagnosis not present

## 2019-01-18 DIAGNOSIS — L600 Ingrowing nail: Secondary | ICD-10-CM | POA: Diagnosis not present

## 2019-01-18 DIAGNOSIS — B351 Tinea unguium: Secondary | ICD-10-CM | POA: Diagnosis not present

## 2019-01-18 DIAGNOSIS — L03031 Cellulitis of right toe: Secondary | ICD-10-CM | POA: Diagnosis not present

## 2019-01-18 DIAGNOSIS — M79675 Pain in left toe(s): Secondary | ICD-10-CM | POA: Diagnosis not present

## 2019-01-31 DIAGNOSIS — E7800 Pure hypercholesterolemia, unspecified: Secondary | ICD-10-CM | POA: Diagnosis not present

## 2019-01-31 DIAGNOSIS — I10 Essential (primary) hypertension: Secondary | ICD-10-CM | POA: Diagnosis not present

## 2019-02-14 ENCOUNTER — Telehealth: Payer: Self-pay | Admitting: Cardiology

## 2019-02-14 DIAGNOSIS — I491 Atrial premature depolarization: Secondary | ICD-10-CM | POA: Insufficient documentation

## 2019-02-14 DIAGNOSIS — I493 Ventricular premature depolarization: Secondary | ICD-10-CM | POA: Insufficient documentation

## 2019-02-14 NOTE — Telephone Encounter (Signed)
Reviewed telemetry from cardiac rehab at [facility]. Frequent PAC's and PVC's seen without definite evidence of atrial fibrillation. Recommend 4 week event monitor, followed by office visit. Okay to continue cardiac rehab.  Thanks MJP

## 2019-02-21 DIAGNOSIS — I5030 Unspecified diastolic (congestive) heart failure: Secondary | ICD-10-CM | POA: Diagnosis not present

## 2019-02-21 DIAGNOSIS — R5383 Other fatigue: Secondary | ICD-10-CM | POA: Diagnosis not present

## 2019-02-21 DIAGNOSIS — R252 Cramp and spasm: Secondary | ICD-10-CM | POA: Diagnosis not present

## 2019-02-21 DIAGNOSIS — Z79899 Other long term (current) drug therapy: Secondary | ICD-10-CM | POA: Diagnosis not present

## 2019-02-21 DIAGNOSIS — Z8249 Family history of ischemic heart disease and other diseases of the circulatory system: Secondary | ICD-10-CM | POA: Diagnosis not present

## 2019-02-21 DIAGNOSIS — E785 Hyperlipidemia, unspecified: Secondary | ICD-10-CM | POA: Diagnosis not present

## 2019-02-21 DIAGNOSIS — Z954 Presence of other heart-valve replacement: Secondary | ICD-10-CM | POA: Diagnosis not present

## 2019-02-21 DIAGNOSIS — N183 Chronic kidney disease, stage 3 unspecified: Secondary | ICD-10-CM | POA: Diagnosis not present

## 2019-02-21 DIAGNOSIS — Z7982 Long term (current) use of aspirin: Secondary | ICD-10-CM | POA: Diagnosis not present

## 2019-02-21 DIAGNOSIS — M545 Low back pain: Secondary | ICD-10-CM | POA: Diagnosis not present

## 2019-02-21 DIAGNOSIS — F329 Major depressive disorder, single episode, unspecified: Secondary | ICD-10-CM | POA: Diagnosis not present

## 2019-02-21 DIAGNOSIS — R0602 Shortness of breath: Secondary | ICD-10-CM | POA: Diagnosis not present

## 2019-02-21 DIAGNOSIS — M79641 Pain in right hand: Secondary | ICD-10-CM | POA: Diagnosis not present

## 2019-02-21 DIAGNOSIS — N200 Calculus of kidney: Secondary | ICD-10-CM | POA: Diagnosis not present

## 2019-02-21 DIAGNOSIS — R200 Anesthesia of skin: Secondary | ICD-10-CM | POA: Diagnosis not present

## 2019-02-21 DIAGNOSIS — I130 Hypertensive heart and chronic kidney disease with heart failure and stage 1 through stage 4 chronic kidney disease, or unspecified chronic kidney disease: Secondary | ICD-10-CM | POA: Diagnosis not present

## 2019-02-21 DIAGNOSIS — R911 Solitary pulmonary nodule: Secondary | ICD-10-CM | POA: Diagnosis not present

## 2019-02-21 DIAGNOSIS — Z7901 Long term (current) use of anticoagulants: Secondary | ICD-10-CM | POA: Diagnosis not present

## 2019-02-21 DIAGNOSIS — Z87891 Personal history of nicotine dependence: Secondary | ICD-10-CM | POA: Diagnosis not present

## 2019-02-21 DIAGNOSIS — Z9889 Other specified postprocedural states: Secondary | ICD-10-CM | POA: Diagnosis not present

## 2019-02-23 DIAGNOSIS — I130 Hypertensive heart and chronic kidney disease with heart failure and stage 1 through stage 4 chronic kidney disease, or unspecified chronic kidney disease: Secondary | ICD-10-CM | POA: Diagnosis not present

## 2019-02-23 DIAGNOSIS — N183 Chronic kidney disease, stage 3 unspecified: Secondary | ICD-10-CM | POA: Diagnosis not present

## 2019-02-23 DIAGNOSIS — I5030 Unspecified diastolic (congestive) heart failure: Secondary | ICD-10-CM | POA: Diagnosis not present

## 2019-02-23 DIAGNOSIS — Z954 Presence of other heart-valve replacement: Secondary | ICD-10-CM | POA: Diagnosis not present

## 2019-02-23 DIAGNOSIS — E785 Hyperlipidemia, unspecified: Secondary | ICD-10-CM | POA: Diagnosis not present

## 2019-02-23 DIAGNOSIS — N200 Calculus of kidney: Secondary | ICD-10-CM | POA: Diagnosis not present

## 2019-02-28 DIAGNOSIS — N200 Calculus of kidney: Secondary | ICD-10-CM | POA: Diagnosis not present

## 2019-02-28 DIAGNOSIS — N183 Chronic kidney disease, stage 3 unspecified: Secondary | ICD-10-CM | POA: Diagnosis not present

## 2019-02-28 DIAGNOSIS — Z954 Presence of other heart-valve replacement: Secondary | ICD-10-CM | POA: Diagnosis not present

## 2019-02-28 DIAGNOSIS — I5030 Unspecified diastolic (congestive) heart failure: Secondary | ICD-10-CM | POA: Diagnosis not present

## 2019-02-28 DIAGNOSIS — I130 Hypertensive heart and chronic kidney disease with heart failure and stage 1 through stage 4 chronic kidney disease, or unspecified chronic kidney disease: Secondary | ICD-10-CM | POA: Diagnosis not present

## 2019-02-28 DIAGNOSIS — E785 Hyperlipidemia, unspecified: Secondary | ICD-10-CM | POA: Diagnosis not present

## 2019-03-01 DIAGNOSIS — I10 Essential (primary) hypertension: Secondary | ICD-10-CM | POA: Diagnosis not present

## 2019-03-01 DIAGNOSIS — E7800 Pure hypercholesterolemia, unspecified: Secondary | ICD-10-CM | POA: Diagnosis not present

## 2019-03-02 DIAGNOSIS — E785 Hyperlipidemia, unspecified: Secondary | ICD-10-CM | POA: Diagnosis not present

## 2019-03-02 DIAGNOSIS — Z954 Presence of other heart-valve replacement: Secondary | ICD-10-CM | POA: Diagnosis not present

## 2019-03-02 DIAGNOSIS — N200 Calculus of kidney: Secondary | ICD-10-CM | POA: Diagnosis not present

## 2019-03-02 DIAGNOSIS — N183 Chronic kidney disease, stage 3 unspecified: Secondary | ICD-10-CM | POA: Diagnosis not present

## 2019-03-02 DIAGNOSIS — I130 Hypertensive heart and chronic kidney disease with heart failure and stage 1 through stage 4 chronic kidney disease, or unspecified chronic kidney disease: Secondary | ICD-10-CM | POA: Diagnosis not present

## 2019-03-02 DIAGNOSIS — I5030 Unspecified diastolic (congestive) heart failure: Secondary | ICD-10-CM | POA: Diagnosis not present

## 2019-03-07 DIAGNOSIS — N200 Calculus of kidney: Secondary | ICD-10-CM | POA: Diagnosis not present

## 2019-03-07 DIAGNOSIS — I130 Hypertensive heart and chronic kidney disease with heart failure and stage 1 through stage 4 chronic kidney disease, or unspecified chronic kidney disease: Secondary | ICD-10-CM | POA: Diagnosis not present

## 2019-03-07 DIAGNOSIS — Z954 Presence of other heart-valve replacement: Secondary | ICD-10-CM | POA: Diagnosis not present

## 2019-03-07 DIAGNOSIS — N183 Chronic kidney disease, stage 3 unspecified: Secondary | ICD-10-CM | POA: Diagnosis not present

## 2019-03-07 DIAGNOSIS — E785 Hyperlipidemia, unspecified: Secondary | ICD-10-CM | POA: Diagnosis not present

## 2019-03-07 DIAGNOSIS — I5030 Unspecified diastolic (congestive) heart failure: Secondary | ICD-10-CM | POA: Diagnosis not present

## 2019-03-09 DIAGNOSIS — I130 Hypertensive heart and chronic kidney disease with heart failure and stage 1 through stage 4 chronic kidney disease, or unspecified chronic kidney disease: Secondary | ICD-10-CM | POA: Diagnosis not present

## 2019-03-09 DIAGNOSIS — N183 Chronic kidney disease, stage 3 unspecified: Secondary | ICD-10-CM | POA: Diagnosis not present

## 2019-03-09 DIAGNOSIS — N200 Calculus of kidney: Secondary | ICD-10-CM | POA: Diagnosis not present

## 2019-03-09 DIAGNOSIS — Z954 Presence of other heart-valve replacement: Secondary | ICD-10-CM | POA: Diagnosis not present

## 2019-03-09 DIAGNOSIS — E785 Hyperlipidemia, unspecified: Secondary | ICD-10-CM | POA: Diagnosis not present

## 2019-03-09 DIAGNOSIS — I5030 Unspecified diastolic (congestive) heart failure: Secondary | ICD-10-CM | POA: Diagnosis not present

## 2019-03-14 DIAGNOSIS — N183 Chronic kidney disease, stage 3 unspecified: Secondary | ICD-10-CM | POA: Diagnosis not present

## 2019-03-14 DIAGNOSIS — I5030 Unspecified diastolic (congestive) heart failure: Secondary | ICD-10-CM | POA: Diagnosis not present

## 2019-03-14 DIAGNOSIS — N200 Calculus of kidney: Secondary | ICD-10-CM | POA: Diagnosis not present

## 2019-03-14 DIAGNOSIS — E785 Hyperlipidemia, unspecified: Secondary | ICD-10-CM | POA: Diagnosis not present

## 2019-03-14 DIAGNOSIS — I130 Hypertensive heart and chronic kidney disease with heart failure and stage 1 through stage 4 chronic kidney disease, or unspecified chronic kidney disease: Secondary | ICD-10-CM | POA: Diagnosis not present

## 2019-03-14 DIAGNOSIS — Z954 Presence of other heart-valve replacement: Secondary | ICD-10-CM | POA: Diagnosis not present

## 2019-03-16 ENCOUNTER — Ambulatory Visit: Payer: Medicare Other | Admitting: Surgery

## 2019-03-16 ENCOUNTER — Encounter (HOSPITAL_COMMUNITY): Admission: RE | Admit: 2019-03-16 | Payer: Medicare Other | Source: Ambulatory Visit

## 2019-03-16 ENCOUNTER — Other Ambulatory Visit: Payer: Self-pay

## 2019-03-17 DIAGNOSIS — N200 Calculus of kidney: Secondary | ICD-10-CM | POA: Diagnosis not present

## 2019-03-17 DIAGNOSIS — I130 Hypertensive heart and chronic kidney disease with heart failure and stage 1 through stage 4 chronic kidney disease, or unspecified chronic kidney disease: Secondary | ICD-10-CM | POA: Diagnosis not present

## 2019-03-17 DIAGNOSIS — E785 Hyperlipidemia, unspecified: Secondary | ICD-10-CM | POA: Diagnosis not present

## 2019-03-17 DIAGNOSIS — I5030 Unspecified diastolic (congestive) heart failure: Secondary | ICD-10-CM | POA: Diagnosis not present

## 2019-03-17 DIAGNOSIS — Z954 Presence of other heart-valve replacement: Secondary | ICD-10-CM | POA: Diagnosis not present

## 2019-03-17 DIAGNOSIS — N183 Chronic kidney disease, stage 3 unspecified: Secondary | ICD-10-CM | POA: Diagnosis not present

## 2019-03-21 DIAGNOSIS — M545 Low back pain: Secondary | ICD-10-CM | POA: Diagnosis not present

## 2019-03-21 DIAGNOSIS — I130 Hypertensive heart and chronic kidney disease with heart failure and stage 1 through stage 4 chronic kidney disease, or unspecified chronic kidney disease: Secondary | ICD-10-CM | POA: Diagnosis not present

## 2019-03-21 DIAGNOSIS — E785 Hyperlipidemia, unspecified: Secondary | ICD-10-CM | POA: Diagnosis not present

## 2019-03-21 DIAGNOSIS — Z7982 Long term (current) use of aspirin: Secondary | ICD-10-CM | POA: Diagnosis not present

## 2019-03-21 DIAGNOSIS — N183 Chronic kidney disease, stage 3 unspecified: Secondary | ICD-10-CM | POA: Diagnosis not present

## 2019-03-21 DIAGNOSIS — F329 Major depressive disorder, single episode, unspecified: Secondary | ICD-10-CM | POA: Diagnosis not present

## 2019-03-21 DIAGNOSIS — I5030 Unspecified diastolic (congestive) heart failure: Secondary | ICD-10-CM | POA: Diagnosis not present

## 2019-03-21 DIAGNOSIS — Z79899 Other long term (current) drug therapy: Secondary | ICD-10-CM | POA: Diagnosis not present

## 2019-03-21 DIAGNOSIS — M13841 Other specified arthritis, right hand: Secondary | ICD-10-CM | POA: Diagnosis not present

## 2019-03-21 DIAGNOSIS — Z954 Presence of other heart-valve replacement: Secondary | ICD-10-CM | POA: Diagnosis not present

## 2019-03-21 DIAGNOSIS — Z7902 Long term (current) use of antithrombotics/antiplatelets: Secondary | ICD-10-CM | POA: Diagnosis not present

## 2019-03-21 DIAGNOSIS — Z720 Tobacco use: Secondary | ICD-10-CM | POA: Diagnosis not present

## 2019-03-21 DIAGNOSIS — Z8249 Family history of ischemic heart disease and other diseases of the circulatory system: Secondary | ICD-10-CM | POA: Diagnosis not present

## 2019-03-23 DIAGNOSIS — I5030 Unspecified diastolic (congestive) heart failure: Secondary | ICD-10-CM | POA: Diagnosis not present

## 2019-03-23 DIAGNOSIS — Z954 Presence of other heart-valve replacement: Secondary | ICD-10-CM | POA: Diagnosis not present

## 2019-03-23 DIAGNOSIS — Z720 Tobacco use: Secondary | ICD-10-CM | POA: Diagnosis not present

## 2019-03-23 DIAGNOSIS — I130 Hypertensive heart and chronic kidney disease with heart failure and stage 1 through stage 4 chronic kidney disease, or unspecified chronic kidney disease: Secondary | ICD-10-CM | POA: Diagnosis not present

## 2019-03-23 DIAGNOSIS — E785 Hyperlipidemia, unspecified: Secondary | ICD-10-CM | POA: Diagnosis not present

## 2019-03-23 DIAGNOSIS — N183 Chronic kidney disease, stage 3 unspecified: Secondary | ICD-10-CM | POA: Diagnosis not present

## 2019-03-28 DIAGNOSIS — Z1231 Encounter for screening mammogram for malignant neoplasm of breast: Secondary | ICD-10-CM | POA: Diagnosis not present

## 2019-03-29 ENCOUNTER — Ambulatory Visit (HOSPITAL_COMMUNITY): Payer: Medicare Other

## 2019-03-30 DIAGNOSIS — I5030 Unspecified diastolic (congestive) heart failure: Secondary | ICD-10-CM | POA: Diagnosis not present

## 2019-03-30 DIAGNOSIS — Z954 Presence of other heart-valve replacement: Secondary | ICD-10-CM | POA: Diagnosis not present

## 2019-03-30 DIAGNOSIS — N183 Chronic kidney disease, stage 3 unspecified: Secondary | ICD-10-CM | POA: Diagnosis not present

## 2019-03-30 DIAGNOSIS — E785 Hyperlipidemia, unspecified: Secondary | ICD-10-CM | POA: Diagnosis not present

## 2019-03-30 DIAGNOSIS — I130 Hypertensive heart and chronic kidney disease with heart failure and stage 1 through stage 4 chronic kidney disease, or unspecified chronic kidney disease: Secondary | ICD-10-CM | POA: Diagnosis not present

## 2019-03-30 DIAGNOSIS — Z720 Tobacco use: Secondary | ICD-10-CM | POA: Diagnosis not present

## 2019-04-01 DIAGNOSIS — N183 Chronic kidney disease, stage 3 unspecified: Secondary | ICD-10-CM | POA: Diagnosis not present

## 2019-04-01 DIAGNOSIS — E785 Hyperlipidemia, unspecified: Secondary | ICD-10-CM | POA: Diagnosis not present

## 2019-04-01 DIAGNOSIS — I130 Hypertensive heart and chronic kidney disease with heart failure and stage 1 through stage 4 chronic kidney disease, or unspecified chronic kidney disease: Secondary | ICD-10-CM | POA: Diagnosis not present

## 2019-04-01 DIAGNOSIS — Z954 Presence of other heart-valve replacement: Secondary | ICD-10-CM | POA: Diagnosis not present

## 2019-04-01 DIAGNOSIS — Z720 Tobacco use: Secondary | ICD-10-CM | POA: Diagnosis not present

## 2019-04-01 DIAGNOSIS — I5030 Unspecified diastolic (congestive) heart failure: Secondary | ICD-10-CM | POA: Diagnosis not present

## 2019-04-04 DIAGNOSIS — Z720 Tobacco use: Secondary | ICD-10-CM | POA: Diagnosis not present

## 2019-04-04 DIAGNOSIS — I5030 Unspecified diastolic (congestive) heart failure: Secondary | ICD-10-CM | POA: Diagnosis not present

## 2019-04-04 DIAGNOSIS — I130 Hypertensive heart and chronic kidney disease with heart failure and stage 1 through stage 4 chronic kidney disease, or unspecified chronic kidney disease: Secondary | ICD-10-CM | POA: Diagnosis not present

## 2019-04-04 DIAGNOSIS — Z954 Presence of other heart-valve replacement: Secondary | ICD-10-CM | POA: Diagnosis not present

## 2019-04-04 DIAGNOSIS — E785 Hyperlipidemia, unspecified: Secondary | ICD-10-CM | POA: Diagnosis not present

## 2019-04-04 DIAGNOSIS — N183 Chronic kidney disease, stage 3 unspecified: Secondary | ICD-10-CM | POA: Diagnosis not present

## 2019-04-06 ENCOUNTER — Ambulatory Visit (HOSPITAL_COMMUNITY)
Admission: RE | Admit: 2019-04-06 | Discharge: 2019-04-06 | Disposition: A | Discharge status: Home / Self Care | Payer: Medicare Other | Source: Ambulatory Visit | Attending: Physician Assistant | Admitting: Physician Assistant

## 2019-04-06 ENCOUNTER — Encounter: Payer: Self-pay | Admitting: Surgery

## 2019-04-06 ENCOUNTER — Other Ambulatory Visit: Payer: Self-pay

## 2019-04-06 ENCOUNTER — Ambulatory Visit (INDEPENDENT_AMBULATORY_CARE_PROVIDER_SITE_OTHER): Payer: Medicare Other | Admitting: Surgery

## 2019-04-06 VITALS — BP 165/77 | HR 67 | Resp 20 | Ht 61.0 in | Wt 175.0 lb

## 2019-04-06 DIAGNOSIS — I517 Cardiomegaly: Secondary | ICD-10-CM | POA: Insufficient documentation

## 2019-04-06 DIAGNOSIS — R911 Solitary pulmonary nodule: Secondary | ICD-10-CM | POA: Diagnosis not present

## 2019-04-06 DIAGNOSIS — N200 Calculus of kidney: Secondary | ICD-10-CM | POA: Diagnosis not present

## 2019-04-06 DIAGNOSIS — K449 Diaphragmatic hernia without obstruction or gangrene: Secondary | ICD-10-CM | POA: Insufficient documentation

## 2019-04-06 DIAGNOSIS — Z952 Presence of prosthetic heart valve: Secondary | ICD-10-CM | POA: Diagnosis not present

## 2019-04-06 DIAGNOSIS — I10 Essential (primary) hypertension: Secondary | ICD-10-CM

## 2019-04-06 DIAGNOSIS — I700 Atherosclerosis of aorta: Secondary | ICD-10-CM | POA: Diagnosis not present

## 2019-04-06 DIAGNOSIS — I2510 Atherosclerotic heart disease of native coronary artery without angina pectoris: Secondary | ICD-10-CM | POA: Insufficient documentation

## 2019-04-06 DIAGNOSIS — K5730 Diverticulosis of large intestine without perforation or abscess without bleeding: Secondary | ICD-10-CM | POA: Diagnosis not present

## 2019-04-06 DIAGNOSIS — I5032 Chronic diastolic (congestive) heart failure: Secondary | ICD-10-CM | POA: Diagnosis not present

## 2019-04-06 LAB — GLUCOSE, CAPILLARY: Glucose-Capillary: 116 mg/dL — ABNORMAL HIGH (ref 70–99)

## 2019-04-06 MED ORDER — FLUDEOXYGLUCOSE F - 18 (FDG) INJECTION
8.7800 | Freq: Once | INTRAVENOUS | Status: AC
  Administered 2019-04-06: 10:00:00 8.78 via INTRAVENOUS

## 2019-04-06 NOTE — Progress Notes (Signed)
HPI:  The patient returns today for follow-up of a 1.5 x 1.1 cm macrolobulated nodule in the right lower lobe of lung noted on pre-TAVR CT scan.  She underwent TAVR on 12/07/2018 and had an uncomplicated postoperative course.  She said that she feels much better and denies any shortness of breath.  She quit smoking after TAVR.  Current Outpatient Medications  Medication Sig Dispense Refill  . aspirin 81 MG chewable tablet Chew 1 tablet (81 mg total) by mouth daily. 90 tablet 3  . Calcium Carb-Cholecalciferol (CALCIUM+D3 PO) Take 1 tablet by mouth daily.    . clobetasol ointment (TEMOVATE) [id number] % Apply 1 application topically daily as needed.    . clopidogrel (PLAVIX) 75 MG tablet Take 1 tablet (75 mg total) by mouth daily with breakfast. 90 tablet 1  . lisinopril (ZESTRIL) 40 MG tablet Take 40 mg by mouth daily.    . rosuvastatin (CRESTOR) 20 MG tablet Take 20 mg by mouth every evening.      No current facility-administered medications for this visit.     Physical Exam: BP (!) 165/77   Pulse 67   Resp 20   Ht 5\' 1"  (1.549 m)   Wt 175 lb (79.4 kg)   LMP  (LMP Unknown)   SpO2 93% Comment: RA  BMI 33.07 kg/m  She looks well. Cardiac exam shows a regular rate and rhythm with normal heart sounds.  There is a 2/6 systolic murmur along the right sternal border. Lung exam is clear.  Diagnostic Tests:  CLINICAL DATA:  Initial treatment strategy for pulmonary nodule.  EXAM: NUCLEAR MEDICINE PET SKULL BASE TO THIGH  TECHNIQUE: 8.8 mCi F-18 FDG was injected intravenously. Full-ring PET imaging was performed from the skull base to thigh after the radiotracer. CT data was obtained and used for attenuation correction and anatomic localization.  Fasting blood glucose: 116 mg/dl  COMPARISON:  CT chest 11/29/2018  FINDINGS: Mediastinal blood pool activity: SUV max 2.7  Liver activity: SUV max NA  NECK: Low-grade activity along the tongue base with maximum SUV  6.4, without corresponding CT finding, probably physiologic. Similarly, some mild posterior glottic activity with maximum SUV 6.5 is likely physiologic and without CT correlate.  Incidental CT findings: Mild chronic left maxillary sinusitis. Bilateral common carotid atherosclerotic calcification.  CHEST: The right lower lobe pulmonary nodule has maximum SUV of 1.1, and measures 1.5 by 1.1 cm on image 76/4, stable. This may have a subtle punctate internal calcification.  Incidental CT findings: Mild cardiomegaly. Aortic valve prosthesis. Coronary, aortic arch, and branch vessel atherosclerotic vascular disease. Small type 1 hiatal hernia.  ABDOMEN/PELVIS: No significant abnormal hypermetabolic activity in this region.  Incidental CT findings: Right renal cysts. Right kidney lower pole 0.9 cm nonobstructive renal calculus on image 121/4. Descending and sigmoid colon diverticulosis.  SKELETON: No significant abnormal hypermetabolic activity in this region.  Incidental CT findings: Mild sclerosis of the pubic bones. Lower lumbar spondylosis and degenerative disc disease.  IMPRESSION: 1. The right lower lobe pulmonary nodule is stable in size over the last 4 months at 1.5 by 1.1 cm, and only has very low level activity, maximum SUV 1.1. This is below what I would typically expect for malignancy, and tends to favor a benign process. Punctate calcification centrally within this nodule. Consider surveillance imaging 3 months time. If a more aggressive approach is warranted, the lesion may be amenable to biopsy. 2. Other imaging findings of potential clinical significance: Aortic Atherosclerosis (ICD10-I70.0). Coronary atherosclerosis.  Mild cardiomegaly. Small type 1 hiatal hernia. Nonobstructive right nephrolithiasis. Descending and sigmoid colon diverticulosis.   Electronically Signed   By: Van Clines M.D.   On: 04/06/2019 14:41   Impression:  The right  lower lobe lung nodule is stable in size over the past 4 months and has very low level activity on PET scan.  These 2 things would favor a benign lesion.  In addition there is a small punctate calcification within the nodule which also suggest a benign nodule.  I reviewed the CT images with her and answered her questions.  I recommended that she have a follow-up CT scan of the chest in 6 months.  She is feeling well after TAVR and is continue to abstain from smoking which I encouraged.   Plan:  She will return to see me in 6 months with a CT scan of the chest.  I spent 15 minutes performing this established patient evaluation and > 50% of this time was spent face to face counseling and coordinating the care of this patient's right lower lobe lung nodule.    [name], MD [facility] [phone]

## 2019-04-08 DIAGNOSIS — I130 Hypertensive heart and chronic kidney disease with heart failure and stage 1 through stage 4 chronic kidney disease, or unspecified chronic kidney disease: Secondary | ICD-10-CM | POA: Diagnosis not present

## 2019-04-08 DIAGNOSIS — E785 Hyperlipidemia, unspecified: Secondary | ICD-10-CM | POA: Diagnosis not present

## 2019-04-08 DIAGNOSIS — Z954 Presence of other heart-valve replacement: Secondary | ICD-10-CM | POA: Diagnosis not present

## 2019-04-08 DIAGNOSIS — N183 Chronic kidney disease, stage 3 unspecified: Secondary | ICD-10-CM | POA: Diagnosis not present

## 2019-04-08 DIAGNOSIS — Z720 Tobacco use: Secondary | ICD-10-CM | POA: Diagnosis not present

## 2019-04-08 DIAGNOSIS — I5030 Unspecified diastolic (congestive) heart failure: Secondary | ICD-10-CM | POA: Diagnosis not present

## 2019-04-11 DIAGNOSIS — I130 Hypertensive heart and chronic kidney disease with heart failure and stage 1 through stage 4 chronic kidney disease, or unspecified chronic kidney disease: Secondary | ICD-10-CM | POA: Diagnosis not present

## 2019-04-11 DIAGNOSIS — I5030 Unspecified diastolic (congestive) heart failure: Secondary | ICD-10-CM | POA: Diagnosis not present

## 2019-04-11 DIAGNOSIS — Z720 Tobacco use: Secondary | ICD-10-CM | POA: Diagnosis not present

## 2019-04-11 DIAGNOSIS — E785 Hyperlipidemia, unspecified: Secondary | ICD-10-CM | POA: Diagnosis not present

## 2019-04-11 DIAGNOSIS — Z954 Presence of other heart-valve replacement: Secondary | ICD-10-CM | POA: Diagnosis not present

## 2019-04-11 DIAGNOSIS — N183 Chronic kidney disease, stage 3 unspecified: Secondary | ICD-10-CM | POA: Diagnosis not present

## 2019-04-13 DIAGNOSIS — Z720 Tobacco use: Secondary | ICD-10-CM | POA: Diagnosis not present

## 2019-04-13 DIAGNOSIS — Z954 Presence of other heart-valve replacement: Secondary | ICD-10-CM | POA: Diagnosis not present

## 2019-04-13 DIAGNOSIS — I5030 Unspecified diastolic (congestive) heart failure: Secondary | ICD-10-CM | POA: Diagnosis not present

## 2019-04-13 DIAGNOSIS — I130 Hypertensive heart and chronic kidney disease with heart failure and stage 1 through stage 4 chronic kidney disease, or unspecified chronic kidney disease: Secondary | ICD-10-CM | POA: Diagnosis not present

## 2019-04-13 DIAGNOSIS — N183 Chronic kidney disease, stage 3 unspecified: Secondary | ICD-10-CM | POA: Diagnosis not present

## 2019-04-13 DIAGNOSIS — E785 Hyperlipidemia, unspecified: Secondary | ICD-10-CM | POA: Diagnosis not present

## 2019-04-15 DIAGNOSIS — M545 Low back pain: Secondary | ICD-10-CM | POA: Diagnosis not present

## 2019-04-15 DIAGNOSIS — I272 Pulmonary hypertension, unspecified: Secondary | ICD-10-CM | POA: Diagnosis not present

## 2019-04-15 DIAGNOSIS — I10 Essential (primary) hypertension: Secondary | ICD-10-CM | POA: Diagnosis not present

## 2019-04-15 DIAGNOSIS — Z6833 Body mass index (BMI) 33.0-33.9, adult: Secondary | ICD-10-CM | POA: Diagnosis not present

## 2019-04-15 DIAGNOSIS — Z299 Encounter for prophylactic measures, unspecified: Secondary | ICD-10-CM | POA: Diagnosis not present

## 2019-04-15 DIAGNOSIS — J309 Allergic rhinitis, unspecified: Secondary | ICD-10-CM | POA: Diagnosis not present

## 2019-04-19 DIAGNOSIS — L600 Ingrowing nail: Secondary | ICD-10-CM | POA: Diagnosis not present

## 2019-04-19 DIAGNOSIS — L03031 Cellulitis of right toe: Secondary | ICD-10-CM | POA: Diagnosis not present

## 2019-04-19 DIAGNOSIS — B351 Tinea unguium: Secondary | ICD-10-CM | POA: Diagnosis not present

## 2019-04-19 DIAGNOSIS — L723 Sebaceous cyst: Secondary | ICD-10-CM | POA: Diagnosis not present

## 2019-04-19 DIAGNOSIS — M2041 Other hammer toe(s) (acquired), right foot: Secondary | ICD-10-CM | POA: Diagnosis not present

## 2019-04-19 DIAGNOSIS — M79675 Pain in left toe(s): Secondary | ICD-10-CM | POA: Diagnosis not present

## 2019-04-19 DIAGNOSIS — M79674 Pain in right toe(s): Secondary | ICD-10-CM | POA: Diagnosis not present

## 2019-04-27 DIAGNOSIS — I5032 Chronic diastolic (congestive) heart failure: Secondary | ICD-10-CM | POA: Diagnosis not present

## 2019-04-27 DIAGNOSIS — H6123 Impacted cerumen, bilateral: Secondary | ICD-10-CM | POA: Diagnosis not present

## 2019-04-27 DIAGNOSIS — R600 Localized edema: Secondary | ICD-10-CM | POA: Diagnosis not present

## 2019-04-27 DIAGNOSIS — I10 Essential (primary) hypertension: Secondary | ICD-10-CM | POA: Diagnosis not present

## 2019-04-27 DIAGNOSIS — Z6835 Body mass index (BMI) 35.0-35.9, adult: Secondary | ICD-10-CM | POA: Diagnosis not present

## 2019-04-27 DIAGNOSIS — Z299 Encounter for prophylactic measures, unspecified: Secondary | ICD-10-CM | POA: Diagnosis not present

## 2019-05-12 DIAGNOSIS — J301 Allergic rhinitis due to pollen: Secondary | ICD-10-CM | POA: Diagnosis not present

## 2019-05-27 DIAGNOSIS — I272 Pulmonary hypertension, unspecified: Secondary | ICD-10-CM | POA: Diagnosis not present

## 2019-05-27 DIAGNOSIS — M48061 Spinal stenosis, lumbar region without neurogenic claudication: Secondary | ICD-10-CM | POA: Diagnosis not present

## 2019-05-27 DIAGNOSIS — Z299 Encounter for prophylactic measures, unspecified: Secondary | ICD-10-CM | POA: Diagnosis not present

## 2019-05-27 DIAGNOSIS — Z6834 Body mass index (BMI) 34.0-34.9, adult: Secondary | ICD-10-CM | POA: Diagnosis not present

## 2019-05-27 DIAGNOSIS — I10 Essential (primary) hypertension: Secondary | ICD-10-CM | POA: Diagnosis not present

## 2019-05-27 DIAGNOSIS — M545 Low back pain: Secondary | ICD-10-CM | POA: Diagnosis not present

## 2019-05-27 DIAGNOSIS — I5032 Chronic diastolic (congestive) heart failure: Secondary | ICD-10-CM | POA: Diagnosis not present

## 2019-06-03 ENCOUNTER — Other Ambulatory Visit: Payer: Self-pay | Admitting: Internal Medicine

## 2019-06-03 DIAGNOSIS — M48061 Spinal stenosis, lumbar region without neurogenic claudication: Secondary | ICD-10-CM

## 2019-06-06 ENCOUNTER — Other Ambulatory Visit: Payer: Self-pay | Admitting: Physician Assistant

## 2019-06-06 ENCOUNTER — Telehealth: Payer: Self-pay | Admitting: *Deleted

## 2019-06-06 NOTE — Telephone Encounter (Signed)
   [facility] Pre-operative Risk Assessment    Request for surgical clearance:  1. What type of surgery is being performed? LUMBAR EPIDURAL INJECTION   2. When is this surgery scheduled? TBD   3. What type of clearance is required (medical clearance vs. Pharmacy clearance to hold med vs. Both)? MEDICAL  4. Are there any medications that need to be held prior to surgery and how long? HOLD PLAVIX x 5 DAYS PRIOR TO INJECTION   Practice name and name of physician performing surgery? [facility]; MD IS NOT LISTED AS THIS DEPENDS ON THE DAY THE PROCEDURE IS SET FOR PER [facility] 5. What is your office phone number [phone]    7.   What is your office fax number [phone]  8.   Anesthesia type (None, local, MAC, general) ? LOCAL   Emily Trujillo 06/06/2019, 5:24 PM  _________________________________________________________________   (provider comments below)

## 2019-06-07 NOTE — Telephone Encounter (Signed)
This patient underwent TAVR 11/2018 and has been on ASA/plavix with plans to D/C plavix 05/2019 (after 6 months of therapy). LHC with nonobstructive disease.  We are being asked for guidance to hold plavix for lumbar epidural injection. Since he is at the 6 month window, do you agree that I can instruct him to stop plavix and he does not have to resume after the injection?

## 2019-06-07 NOTE — Telephone Encounter (Signed)
Transferred call to Fabian Sharp.

## 2019-06-07 NOTE — Telephone Encounter (Signed)
Yes. Okay to stop plavix.  Thanks MJP

## 2019-06-07 NOTE — Telephone Encounter (Signed)
Left VM

## 2019-06-07 NOTE — Telephone Encounter (Signed)
   Primary Cardiologist: No primary care provider on file.  Chart reviewed as part of pre-operative protocol coverage. Patient was contacted 06/07/2019 in reference to pre-operative risk assessment for pending surgery as outlined below.  Renea Puleio was last seen on 12/15/18 by Nell Range PAC.  Since that day, Breshae Manieri has done well. She can complete more than 4.0 METS.   Per Dr. Virgina Jock, she may stop plavix permanently now that she is 6 months out from TAVR.  Therefore, based on ACC/AHA guidelines, the patient would be at acceptable risk for the planned procedure without further cardiovascular testing.   I will route this recommendation to the requesting party via Epic fax function and remove from pre-op pool.  Please call with questions.  Tami Lin Halen [place], PA 06/07/2019, 9:27 AM

## 2019-06-07 NOTE — Telephone Encounter (Signed)
Agree.   Emily Trujillo

## 2019-06-08 DIAGNOSIS — I10 Essential (primary) hypertension: Secondary | ICD-10-CM | POA: Diagnosis not present

## 2019-06-08 DIAGNOSIS — E7800 Pure hypercholesterolemia, unspecified: Secondary | ICD-10-CM | POA: Diagnosis not present

## 2019-06-13 ENCOUNTER — Ambulatory Visit
Admission: RE | Admit: 2019-06-13 | Discharge: 2019-06-13 | Disposition: A | Discharge status: Home / Self Care | Payer: Medicare Other | Source: Ambulatory Visit | Attending: Internal Medicine | Admitting: Internal Medicine

## 2019-06-13 DIAGNOSIS — M48061 Spinal stenosis, lumbar region without neurogenic claudication: Secondary | ICD-10-CM

## 2019-06-13 DIAGNOSIS — M5116 Intervertebral disc disorders with radiculopathy, lumbar region: Secondary | ICD-10-CM | POA: Diagnosis not present

## 2019-06-13 MED ORDER — METHYLPREDNISOLONE ACETATE 40 MG/ML INJ SUSP (RADIOLOG
120.0000 mg | Freq: Once | INTRAMUSCULAR | Status: AC
  Administered 2019-06-13: 120 mg via EPIDURAL

## 2019-06-13 MED ORDER — IOPAMIDOL (ISOVUE-M 200) INJECTION 41%
1.0000 mL | Freq: Once | INTRAMUSCULAR | Status: AC
  Administered 2019-06-13: 1 mL via EPIDURAL

## 2019-06-13 NOTE — Discharge Instructions (Signed)

## 2019-07-01 DIAGNOSIS — Z23 Encounter for immunization: Secondary | ICD-10-CM | POA: Diagnosis not present

## 2019-07-26 DIAGNOSIS — L600 Ingrowing nail: Secondary | ICD-10-CM | POA: Diagnosis not present

## 2019-07-26 DIAGNOSIS — L723 Sebaceous cyst: Secondary | ICD-10-CM | POA: Diagnosis not present

## 2019-07-26 DIAGNOSIS — B351 Tinea unguium: Secondary | ICD-10-CM | POA: Diagnosis not present

## 2019-07-26 DIAGNOSIS — M2041 Other hammer toe(s) (acquired), right foot: Secondary | ICD-10-CM | POA: Diagnosis not present

## 2019-07-26 DIAGNOSIS — L602 Onychogryphosis: Secondary | ICD-10-CM | POA: Diagnosis not present

## 2019-07-26 DIAGNOSIS — M79675 Pain in left toe(s): Secondary | ICD-10-CM | POA: Diagnosis not present

## 2019-07-26 DIAGNOSIS — L03031 Cellulitis of right toe: Secondary | ICD-10-CM | POA: Diagnosis not present

## 2019-08-17 DIAGNOSIS — E7800 Pure hypercholesterolemia, unspecified: Secondary | ICD-10-CM | POA: Diagnosis not present

## 2019-08-17 DIAGNOSIS — I10 Essential (primary) hypertension: Secondary | ICD-10-CM | POA: Diagnosis not present

## 2019-09-06 ENCOUNTER — Other Ambulatory Visit: Payer: Self-pay | Admitting: *Deleted

## 2019-09-06 DIAGNOSIS — R911 Solitary pulmonary nodule: Secondary | ICD-10-CM

## 2019-09-06 NOTE — Progress Notes (Unsigned)
Ct ch 

## 2019-09-16 DIAGNOSIS — I10 Essential (primary) hypertension: Secondary | ICD-10-CM | POA: Diagnosis not present

## 2019-09-16 DIAGNOSIS — E7800 Pure hypercholesterolemia, unspecified: Secondary | ICD-10-CM | POA: Diagnosis not present

## 2019-10-12 ENCOUNTER — Other Ambulatory Visit: Payer: Self-pay

## 2019-10-12 ENCOUNTER — Ambulatory Visit (INDEPENDENT_AMBULATORY_CARE_PROVIDER_SITE_OTHER): Payer: Medicare Other | Admitting: Surgery

## 2019-10-12 ENCOUNTER — Encounter: Payer: Self-pay | Admitting: Surgery

## 2019-10-12 ENCOUNTER — Ambulatory Visit
Admission: RE | Admit: 2019-10-12 | Discharge: 2019-10-12 | Disposition: A | Discharge status: Home / Self Care | Payer: Medicare Other | Source: Ambulatory Visit | Attending: Surgery | Admitting: Surgery

## 2019-10-12 VITALS — BP 170/80 | HR 66 | Temp 97.6°F | Resp 20 | Ht 61.0 in | Wt 181.0 lb

## 2019-10-12 DIAGNOSIS — J432 Centrilobular emphysema: Secondary | ICD-10-CM | POA: Diagnosis not present

## 2019-10-12 DIAGNOSIS — E7800 Pure hypercholesterolemia, unspecified: Secondary | ICD-10-CM | POA: Diagnosis not present

## 2019-10-12 DIAGNOSIS — I10 Essential (primary) hypertension: Secondary | ICD-10-CM | POA: Diagnosis not present

## 2019-10-12 DIAGNOSIS — I712 Thoracic aortic aneurysm, without rupture: Secondary | ICD-10-CM | POA: Diagnosis not present

## 2019-10-12 DIAGNOSIS — R911 Solitary pulmonary nodule: Secondary | ICD-10-CM

## 2019-10-12 DIAGNOSIS — I700 Atherosclerosis of aorta: Secondary | ICD-10-CM | POA: Diagnosis not present

## 2019-10-12 DIAGNOSIS — I2510 Atherosclerotic heart disease of native coronary artery without angina pectoris: Secondary | ICD-10-CM | POA: Diagnosis not present

## 2019-10-12 NOTE — Progress Notes (Signed)
HPI:  The patient returns today for follow-up of a 1.5 x 1.1 cm macrolobulated nodule in the right lower lobe of lung noted on pre-TAVR CT scan.  She underwent TAVR on 12/07/2018 and had an uncomplicated postoperative course.  She said that she feels much better and denies any shortness of breath.  She quit smoking after TAVR.  Her only complaint is that she has gained weight.  Current Outpatient Medications  Medication Sig Dispense Refill  . aspirin 81 MG chewable tablet Chew 1 tablet (81 mg total) by mouth daily. 90 tablet 3  . Calcium Carb-Cholecalciferol (CALCIUM+D3 PO) Take 1 tablet by mouth daily.    . clobetasol ointment (TEMOVATE) 7.40 % Apply 1 application topically daily as needed.    . clopidogrel (PLAVIX) 75 MG tablet Take 1 tablet by mouth once daily with breakfast 90 tablet 0  . lisinopril (ZESTRIL) 40 MG tablet Take 40 mg by mouth daily.    . rosuvastatin (CRESTOR) 20 MG tablet Take 20 mg by mouth every evening.      No current facility-administered medications for this visit.     Physical Exam: BP (!) 170/80   Pulse 66   Temp 97.6 F (36.4 C) (Skin)   Resp 20   Ht 5\' 1"  (1.549 m)   Wt 181 lb (82.1 kg)   LMP  (LMP Unknown)   SpO2 92% Comment: RA  BMI 34.20 kg/m  He looks well. Cardiac exam shows regular rate and rhythm with normal heart sounds.  There is a 1/ 6 systolic flow murmur along the right sternal border. Lungs are clear.  Diagnostic Tests:  CLINICAL DATA:  Lung nodule.  Aortic valve replacement.  EXAM: CT CHEST WITHOUT CONTRAST  TECHNIQUE: Multidetector CT imaging of the chest was performed following the standard protocol without IV contrast.  COMPARISON:  PET 04/06/2019 and CT chest 11/29/2018.  FINDINGS: Cardiovascular: Atherosclerotic calcification of the aorta and coronary arteries. Aortic valve replacement. Ascending aorta measures 4.0 cm. Heart is mildly enlarged. No pericardial effusion.  Mediastinum/Nodes: 1.4 cm  low-attenuation right thyroid nodule. No follow-up recommended (ref: J Am Coll Radiol. 2015 Feb;12(2): 143-50).No pathologically enlarged mediastinal or axillary lymph nodes. Hilar regions are difficult to definitively evaluate without IV contrast but appear grossly unremarkable. Esophagus is grossly unremarkable.  Lungs/Pleura: Mild centrilobular emphysema. Residual smoking related respiratory bronchiolitis. 4 mm right upper lobe nodule (8/47), stable and likely benign. 1.1 x 1.4 cm nodule in the superior segment right lower lobe (8/75), unchanged from 11/29/2018. There is internal calcification. Minimal dependent atelectasis bilaterally. Lungs are otherwise clear. No pleural fluid. Airway is unremarkable.  Upper Abdomen: Subcentimeter low-attenuation lesion in the left hepatic lobe is too small to characterize. Visualized portions of the liver, gallbladder and adrenal glands are unremarkable. Low-attenuation lesions in the left kidney measure up to 2.1 cm and are likely cysts although definitive characterization is limited without post-contrast imaging. Spleen and visualized portions of the pancreas, stomach and bowel are grossly unremarkable. No upper abdominal adenopathy.  Musculoskeletal: Degenerative changes in the spine. No worrisome lytic or sclerotic lesions.  IMPRESSION: 1. Right lower lobe nodule is unchanged from 11/29/2018 and has internal calcification. Associated hypo metabolism on PET 04/06/2019. Collectively, findings favor a benign lesion. Additional follow-up in 1 year is recommended to ensure continued stability and exclude adenocarcinoma. 2. Ascending Aortic aneurysm NOS (ICD10-I71.9), stable, status post TAVR. 3. Aortic atherosclerosis (ICD10-I70.0). Coronary artery calcification. 4.  Emphysema (ICD10-J43.9).   Electronically Signed   By: Rip Harbour  Blietz M.D.   On: 10/12/2019 13:46   Impression:  She has a stable 1.1 x 1.4 cm nodule in the  superior segment of the right lower lobe is unchanged dating back to 11/29/2018.  This has some internal calcification and had minimal uptake on PET scan in February 2021.  This is most likely a benign lesion but a follow-up in 1 year has been recommended.  I reviewed the CT scan images with the patient and answered her questions.  Plan:  She will return to see me in 1 year with a CT scan of the chest without contrast.  I spent 15 minutes performing this established patient evaluation and > 50% of this time was spent face to face counseling and coordinating the care of this patient's right lower lobe lung nodule.    [name], MD [facility] [phone]

## 2019-10-27 ENCOUNTER — Other Ambulatory Visit: Payer: Self-pay | Admitting: Cardiology

## 2019-10-27 DIAGNOSIS — Z952 Presence of prosthetic heart valve: Secondary | ICD-10-CM

## 2019-10-27 DIAGNOSIS — I350 Nonrheumatic aortic (valve) stenosis: Secondary | ICD-10-CM

## 2019-11-01 DIAGNOSIS — L600 Ingrowing nail: Secondary | ICD-10-CM | POA: Diagnosis not present

## 2019-11-01 DIAGNOSIS — L03031 Cellulitis of right toe: Secondary | ICD-10-CM | POA: Diagnosis not present

## 2019-11-01 DIAGNOSIS — M79674 Pain in right toe(s): Secondary | ICD-10-CM | POA: Diagnosis not present

## 2019-11-01 DIAGNOSIS — M2041 Other hammer toe(s) (acquired), right foot: Secondary | ICD-10-CM | POA: Diagnosis not present

## 2019-11-01 DIAGNOSIS — B351 Tinea unguium: Secondary | ICD-10-CM | POA: Diagnosis not present

## 2019-11-01 DIAGNOSIS — M79675 Pain in left toe(s): Secondary | ICD-10-CM | POA: Diagnosis not present

## 2019-11-01 DIAGNOSIS — L602 Onychogryphosis: Secondary | ICD-10-CM | POA: Diagnosis not present

## 2019-11-10 ENCOUNTER — Other Ambulatory Visit: Payer: Self-pay | Admitting: Cardiology

## 2019-11-10 DIAGNOSIS — I350 Nonrheumatic aortic (valve) stenosis: Secondary | ICD-10-CM

## 2019-11-11 ENCOUNTER — Other Ambulatory Visit: Payer: Self-pay | Admitting: Cardiology

## 2019-11-11 DIAGNOSIS — I350 Nonrheumatic aortic (valve) stenosis: Secondary | ICD-10-CM

## 2019-11-17 DIAGNOSIS — I10 Essential (primary) hypertension: Secondary | ICD-10-CM | POA: Diagnosis not present

## 2019-11-17 DIAGNOSIS — E7800 Pure hypercholesterolemia, unspecified: Secondary | ICD-10-CM | POA: Diagnosis not present

## 2019-12-16 DIAGNOSIS — M5136 Other intervertebral disc degeneration, lumbar region: Secondary | ICD-10-CM | POA: Diagnosis not present

## 2019-12-16 DIAGNOSIS — I5032 Chronic diastolic (congestive) heart failure: Secondary | ICD-10-CM | POA: Diagnosis not present

## 2019-12-16 DIAGNOSIS — Z87891 Personal history of nicotine dependence: Secondary | ICD-10-CM | POA: Diagnosis not present

## 2019-12-16 DIAGNOSIS — I8393 Asymptomatic varicose veins of bilateral lower extremities: Secondary | ICD-10-CM | POA: Diagnosis not present

## 2019-12-16 DIAGNOSIS — Z6833 Body mass index (BMI) 33.0-33.9, adult: Secondary | ICD-10-CM | POA: Diagnosis not present

## 2019-12-16 DIAGNOSIS — I10 Essential (primary) hypertension: Secondary | ICD-10-CM | POA: Diagnosis not present

## 2019-12-16 DIAGNOSIS — M48061 Spinal stenosis, lumbar region without neurogenic claudication: Secondary | ICD-10-CM | POA: Diagnosis not present

## 2019-12-16 DIAGNOSIS — Z299 Encounter for prophylactic measures, unspecified: Secondary | ICD-10-CM | POA: Diagnosis not present

## 2019-12-16 DIAGNOSIS — E7800 Pure hypercholesterolemia, unspecified: Secondary | ICD-10-CM | POA: Diagnosis not present

## 2019-12-16 DIAGNOSIS — I700 Atherosclerosis of aorta: Secondary | ICD-10-CM | POA: Diagnosis not present

## 2019-12-16 DIAGNOSIS — Z2821 Immunization not carried out because of patient refusal: Secondary | ICD-10-CM | POA: Diagnosis not present

## 2019-12-16 DIAGNOSIS — I739 Peripheral vascular disease, unspecified: Secondary | ICD-10-CM | POA: Diagnosis not present

## 2019-12-26 DIAGNOSIS — I2510 Atherosclerotic heart disease of native coronary artery without angina pectoris: Secondary | ICD-10-CM | POA: Diagnosis not present

## 2019-12-26 DIAGNOSIS — I8390 Asymptomatic varicose veins of unspecified lower extremity: Secondary | ICD-10-CM | POA: Diagnosis not present

## 2019-12-26 DIAGNOSIS — I70211 Atherosclerosis of native arteries of extremities with intermittent claudication, right leg: Secondary | ICD-10-CM | POA: Diagnosis not present

## 2019-12-26 DIAGNOSIS — I83811 Varicose veins of right lower extremities with pain: Secondary | ICD-10-CM | POA: Diagnosis not present

## 2019-12-28 ENCOUNTER — Other Ambulatory Visit: Payer: Medicare Other

## 2019-12-28 ENCOUNTER — Ambulatory Visit: Payer: Medicare Other | Admitting: Cardiology

## 2019-12-28 ENCOUNTER — Ambulatory Visit: Payer: Medicare Other

## 2019-12-28 ENCOUNTER — Other Ambulatory Visit: Payer: Self-pay

## 2019-12-28 ENCOUNTER — Encounter: Payer: Self-pay | Admitting: Cardiology

## 2019-12-28 VITALS — BP 148/71 | HR 63 | Resp 16 | Ht 61.0 in | Wt 181.0 lb

## 2019-12-28 DIAGNOSIS — I350 Nonrheumatic aortic (valve) stenosis: Secondary | ICD-10-CM

## 2019-12-28 DIAGNOSIS — Z5181 Encounter for therapeutic drug level monitoring: Secondary | ICD-10-CM

## 2019-12-28 DIAGNOSIS — I10 Essential (primary) hypertension: Secondary | ICD-10-CM

## 2019-12-28 DIAGNOSIS — Z952 Presence of prosthetic heart valve: Secondary | ICD-10-CM | POA: Diagnosis not present

## 2019-12-28 NOTE — Progress Notes (Signed)
Patient referred by Glenda Chroman, MD for severer aortic stenosis, moderate AR.  Subjective:   Emily Trujillo, female    DOB: [birth year], 79 y.o.   MRN: [id number]   Chief Complaint  Patient presents with  . S/P TAVR (transcatheter aortic valve replacement)  . Follow-up     HPI  79 y.o. Caucasian female with hypertension, hyperlipidemia, h/o tobacco abuse, critical aortic stenosis, Now s/p Edwards Sapien 3 THV.  Patient is doing well and is able to walk up and down stairs with no significant exertional dyspnea.  She denies any chest pain.  She has quit smoking.  Blood pressure is generally well controlled according to her. She has not taken her lisinopril today. She has recently had back and leg pain. Leg pain is usually when she sleeps at night. She does not have calf or thigh pain when she exercises. She recently underwent LE ABI/US? At [facility], results not available to me. She has annual physical with her PCP tomorrow, where she will have her labs checked.    Current Outpatient Medications on File Prior to Visit  Medication Sig Dispense Refill  . aspirin 81 MG chewable tablet Chew 1 tablet (81 mg total) by mouth daily. 90 tablet 3  . Calcium Carb-Cholecalciferol (CALCIUM+D3 PO) Take 1 tablet by mouth daily.    . clobetasol ointment (TEMOVATE) 9.48 % Apply 1 application topically daily as needed.    . clopidogrel (PLAVIX) 75 MG tablet Take 1 tablet by mouth once daily with breakfast 90 tablet 0  . furosemide (LASIX) 20 MG tablet Take 20 mg by mouth daily.    Marland Kitchen lisinopril (ZESTRIL) 40 MG tablet Take 40 mg by mouth daily.    . meloxicam (MOBIC) 15 MG tablet Take 15 mg by mouth daily.    . rosuvastatin (CRESTOR) 20 MG tablet Take 20 mg by mouth every evening.      No current facility-administered medications on file prior to visit.    Cardiovascular studies:  Echocardiogram 12/28/2019: Left ventricle cavity is normal in size. Normal left ventricular wall thickness. Normal global  wall motion. Normal LV systolic function with visual EF 55-60%. Doppler evidence of grade I (impaired) diastolic dysfunction, normal LAP.  Well seated Edwards Sapien 3 THV. Mean PG 14 mmHg, which is likely. No regurgitation. No evidence of pulmonary hypertension. No significant change compared to previous study on 01/07/2019.  EKG 12/28/2019: Sinus rhythm 56 bpm Left anterior fascicular block Cannot exclude old anteroseptal infarct  LHC/RHC 11/23/2018: LM: Large vessel. Mid focal 30% stenosis LAD: Mild luminal irregularities. Ostial Diag1 20% stenosis. LCXL: No significant abnormalities. RCA: Large vessel. Prox 40%, mid 20%, and distal 20% stenoses.    RA: 2 mmHg RV: 46/0 mmHg PA: 53/17 mmHg, mean PAP 32 mmHg PCWP: 16 mmHg  CO: 4.0 L/min CI: 2.2 L/min/m2   EKG 11/12/2018: Sinus rhythm 54 bpm. Occasional PAC.    Left axis -anterior fascicular block.  Left ventricular hypertrophy (strain).  Nonspecific ST depression, likely due to LVH.  Echocardiogram 10/06/2018: Normal LV size. Mod LVH. EF 55-60%.  Critical AS. Mean PG 66 mmHg. Peak vel 5.1 m/sec. AVA 0.49 cm2. Mild AI. Mild MV calcification. Mild to mod MR.  Mild TR. PASP 28 mmHg.  >50% IVC collapse.  Recent labs: 09/2018: Glucose 121, BUN/Cr 20/1.05. eGFR 51. Na/K 140/3.8. Rest of the CMP normal. H/H 11.4/36/4. MCV 96. Platelets 202.  Trop 0.05-0.07. BNP 798.   Review of Systems  Cardiovascular: Negative for chest pain, dyspnea on exertion,  leg swelling, palpitations and syncope.         Vitals:   12/28/19 1126  BP: (!) 148/71  Pulse: 63  Resp: 16  SpO2: 97%     Body mass index is 34.2 kg/m. Filed Weights   12/28/19 1126  Weight: 181 lb (82.1 kg)     Objective:   Physical Exam Vitals and nursing note reviewed.  Constitutional:      General: She is not in acute distress.    Appearance: She is well-developed.  Neck:     Vascular: No JVD.  Cardiovascular:     Rate and Rhythm: Normal rate and  regular rhythm.     Pulses: Intact distal pulses.          Femoral pulses are 2+ on the right side and 2+ on the left side.      Popliteal pulses are 1+ on the right side and 1+ on the left side.       Dorsalis pedis pulses are 0 on the right side and 0 on the left side.       Posterior tibial pulses are 0 on the right side and 0 on the left side.     Heart sounds: Murmur heard.  Harsh crescendo-decrescendo midsystolic murmur is present with a grade of 4/6 at the upper right sternal border radiating to the neck.      Comments: No signs of iscehmia Pulmonary:     Effort: Pulmonary effort is normal.     Breath sounds: Normal breath sounds. No wheezing or rales.  Abdominal:     Palpations: Abdomen is soft.  Neurological:     Mental Status: She is alert.         Assessment & Recommendations:   79 y.o. Caucasian female with hypertension, hyperlipidemia, stage D critical aortic stenosis, now s/p  Edwards Sapien 3 THV  Severe aortic stenosis, now s/p Edwards Sapien 2 THV: Normal functioning valve with expected mean gradient of 14 mmHg.  No paravalvular leak. Continue aspirin. She will need endocarditis prophylaxis for dental procedures, NYHA class I symptoms.  Mild nonobstructive CAD: Continue medical management with aspirin, statin, and blood pressure control. She does not need DAPT. Stopped plavix.  I congratulated the patient on quitting smoking.  She has regular follow-up with her PCP Dr. Woody Seller. Will get labs  Leg pain: While she may have PAD< her symptoms are not typical of claudication. Will obtain LE [place] results from [place]. Continue risk factor modification.     Nigel Mormon, MD [facility] [place] Pager: [phone] Office: [phone] If no answer Cell [phone]

## 2020-01-16 DIAGNOSIS — R5383 Other fatigue: Secondary | ICD-10-CM | POA: Diagnosis not present

## 2020-01-16 DIAGNOSIS — E7800 Pure hypercholesterolemia, unspecified: Secondary | ICD-10-CM | POA: Diagnosis not present

## 2020-01-16 DIAGNOSIS — Z79899 Other long term (current) drug therapy: Secondary | ICD-10-CM | POA: Diagnosis not present

## 2020-01-16 DIAGNOSIS — Z0000 Encounter for general adult medical examination without abnormal findings: Secondary | ICD-10-CM | POA: Diagnosis not present

## 2020-01-16 DIAGNOSIS — Z6833 Body mass index (BMI) 33.0-33.9, adult: Secondary | ICD-10-CM | POA: Diagnosis not present

## 2020-01-16 DIAGNOSIS — Z1339 Encounter for screening examination for other mental health and behavioral disorders: Secondary | ICD-10-CM | POA: Diagnosis not present

## 2020-01-16 DIAGNOSIS — I10 Essential (primary) hypertension: Secondary | ICD-10-CM | POA: Diagnosis not present

## 2020-01-16 DIAGNOSIS — Z1331 Encounter for screening for depression: Secondary | ICD-10-CM | POA: Diagnosis not present

## 2020-01-16 DIAGNOSIS — Z299 Encounter for prophylactic measures, unspecified: Secondary | ICD-10-CM | POA: Diagnosis not present

## 2020-01-16 DIAGNOSIS — Z87891 Personal history of nicotine dependence: Secondary | ICD-10-CM | POA: Diagnosis not present

## 2020-01-16 DIAGNOSIS — Z7189 Other specified counseling: Secondary | ICD-10-CM | POA: Diagnosis not present

## 2020-01-17 DIAGNOSIS — I10 Essential (primary) hypertension: Secondary | ICD-10-CM | POA: Diagnosis not present

## 2020-01-17 DIAGNOSIS — E7800 Pure hypercholesterolemia, unspecified: Secondary | ICD-10-CM | POA: Diagnosis not present

## 2020-01-31 DIAGNOSIS — M79674 Pain in right toe(s): Secondary | ICD-10-CM | POA: Diagnosis not present

## 2020-01-31 DIAGNOSIS — L03031 Cellulitis of right toe: Secondary | ICD-10-CM | POA: Diagnosis not present

## 2020-01-31 DIAGNOSIS — L602 Onychogryphosis: Secondary | ICD-10-CM | POA: Diagnosis not present

## 2020-01-31 DIAGNOSIS — L600 Ingrowing nail: Secondary | ICD-10-CM | POA: Diagnosis not present

## 2020-01-31 DIAGNOSIS — B351 Tinea unguium: Secondary | ICD-10-CM | POA: Diagnosis not present

## 2020-01-31 DIAGNOSIS — M79675 Pain in left toe(s): Secondary | ICD-10-CM | POA: Diagnosis not present

## 2020-02-02 DIAGNOSIS — Z23 Encounter for immunization: Secondary | ICD-10-CM | POA: Diagnosis not present

## 2020-02-03 ENCOUNTER — Ambulatory Visit: Payer: Medicare Other | Admitting: Cardiology

## 2020-02-03 ENCOUNTER — Other Ambulatory Visit: Payer: Medicare Other

## 2020-02-16 DIAGNOSIS — I10 Essential (primary) hypertension: Secondary | ICD-10-CM | POA: Diagnosis not present

## 2020-02-16 DIAGNOSIS — E7800 Pure hypercholesterolemia, unspecified: Secondary | ICD-10-CM | POA: Diagnosis not present

## 2020-03-19 DIAGNOSIS — I10 Essential (primary) hypertension: Secondary | ICD-10-CM | POA: Diagnosis not present

## 2020-03-19 DIAGNOSIS — E7800 Pure hypercholesterolemia, unspecified: Secondary | ICD-10-CM | POA: Diagnosis not present

## 2020-03-19 DIAGNOSIS — I5032 Chronic diastolic (congestive) heart failure: Secondary | ICD-10-CM | POA: Diagnosis not present

## 2020-05-01 DIAGNOSIS — L600 Ingrowing nail: Secondary | ICD-10-CM | POA: Diagnosis not present

## 2020-05-01 DIAGNOSIS — M79674 Pain in right toe(s): Secondary | ICD-10-CM | POA: Diagnosis not present

## 2020-05-01 DIAGNOSIS — B351 Tinea unguium: Secondary | ICD-10-CM | POA: Diagnosis not present

## 2020-05-01 DIAGNOSIS — L03031 Cellulitis of right toe: Secondary | ICD-10-CM | POA: Diagnosis not present

## 2020-05-01 DIAGNOSIS — L602 Onychogryphosis: Secondary | ICD-10-CM | POA: Diagnosis not present

## 2020-05-01 DIAGNOSIS — M79675 Pain in left toe(s): Secondary | ICD-10-CM | POA: Diagnosis not present

## 2020-05-16 DIAGNOSIS — E7800 Pure hypercholesterolemia, unspecified: Secondary | ICD-10-CM | POA: Diagnosis not present

## 2020-05-16 DIAGNOSIS — I10 Essential (primary) hypertension: Secondary | ICD-10-CM | POA: Diagnosis not present

## 2020-05-16 DIAGNOSIS — I5032 Chronic diastolic (congestive) heart failure: Secondary | ICD-10-CM | POA: Diagnosis not present

## 2020-07-03 DIAGNOSIS — I700 Atherosclerosis of aorta: Secondary | ICD-10-CM | POA: Diagnosis not present

## 2020-07-03 DIAGNOSIS — M159 Polyosteoarthritis, unspecified: Secondary | ICD-10-CM | POA: Diagnosis not present

## 2020-07-03 DIAGNOSIS — I739 Peripheral vascular disease, unspecified: Secondary | ICD-10-CM | POA: Diagnosis not present

## 2020-07-03 DIAGNOSIS — Z299 Encounter for prophylactic measures, unspecified: Secondary | ICD-10-CM | POA: Diagnosis not present

## 2020-07-03 DIAGNOSIS — I10 Essential (primary) hypertension: Secondary | ICD-10-CM | POA: Diagnosis not present

## 2020-07-03 DIAGNOSIS — H6981 Other specified disorders of Eustachian tube, right ear: Secondary | ICD-10-CM | POA: Diagnosis not present

## 2020-07-16 DIAGNOSIS — Z862 Personal history of diseases of the blood and blood-forming organs and certain disorders involving the immune mechanism: Secondary | ICD-10-CM | POA: Diagnosis not present

## 2020-07-16 DIAGNOSIS — J45909 Unspecified asthma, uncomplicated: Secondary | ICD-10-CM | POA: Diagnosis not present

## 2020-07-17 DIAGNOSIS — J209 Acute bronchitis, unspecified: Secondary | ICD-10-CM | POA: Diagnosis not present

## 2020-07-17 DIAGNOSIS — Z299 Encounter for prophylactic measures, unspecified: Secondary | ICD-10-CM | POA: Diagnosis not present

## 2020-08-13 DIAGNOSIS — I10 Essential (primary) hypertension: Secondary | ICD-10-CM | POA: Diagnosis not present

## 2020-08-13 DIAGNOSIS — I700 Atherosclerosis of aorta: Secondary | ICD-10-CM | POA: Diagnosis not present

## 2020-08-13 DIAGNOSIS — I5032 Chronic diastolic (congestive) heart failure: Secondary | ICD-10-CM | POA: Diagnosis not present

## 2020-08-13 DIAGNOSIS — Z299 Encounter for prophylactic measures, unspecified: Secondary | ICD-10-CM | POA: Diagnosis not present

## 2020-08-13 DIAGNOSIS — M545 Low back pain, unspecified: Secondary | ICD-10-CM | POA: Diagnosis not present

## 2020-08-14 DIAGNOSIS — L723 Sebaceous cyst: Secondary | ICD-10-CM | POA: Diagnosis not present

## 2020-08-14 DIAGNOSIS — L600 Ingrowing nail: Secondary | ICD-10-CM | POA: Diagnosis not present

## 2020-08-14 DIAGNOSIS — L602 Onychogryphosis: Secondary | ICD-10-CM | POA: Diagnosis not present

## 2020-08-14 DIAGNOSIS — M79671 Pain in right foot: Secondary | ICD-10-CM | POA: Diagnosis not present

## 2020-08-14 DIAGNOSIS — M79675 Pain in left toe(s): Secondary | ICD-10-CM | POA: Diagnosis not present

## 2020-08-14 DIAGNOSIS — B351 Tinea unguium: Secondary | ICD-10-CM | POA: Diagnosis not present

## 2020-08-14 DIAGNOSIS — M79674 Pain in right toe(s): Secondary | ICD-10-CM | POA: Diagnosis not present

## 2020-08-21 ENCOUNTER — Other Ambulatory Visit: Payer: Self-pay | Admitting: *Deleted

## 2020-08-21 DIAGNOSIS — R911 Solitary pulmonary nodule: Secondary | ICD-10-CM

## 2020-09-03 IMAGING — XA Imaging study
1 series · 1 of 1 positions shown · non-contrast
Comparison: none

CLINICAL DATA: Displacement of the L4-5 and L5-S1 lumbar discs. Low
back pain. Right lower extremity radiculopathy.

[Series 1: ortho adipose · 1 of 1 slices shown]
[im 1/1]
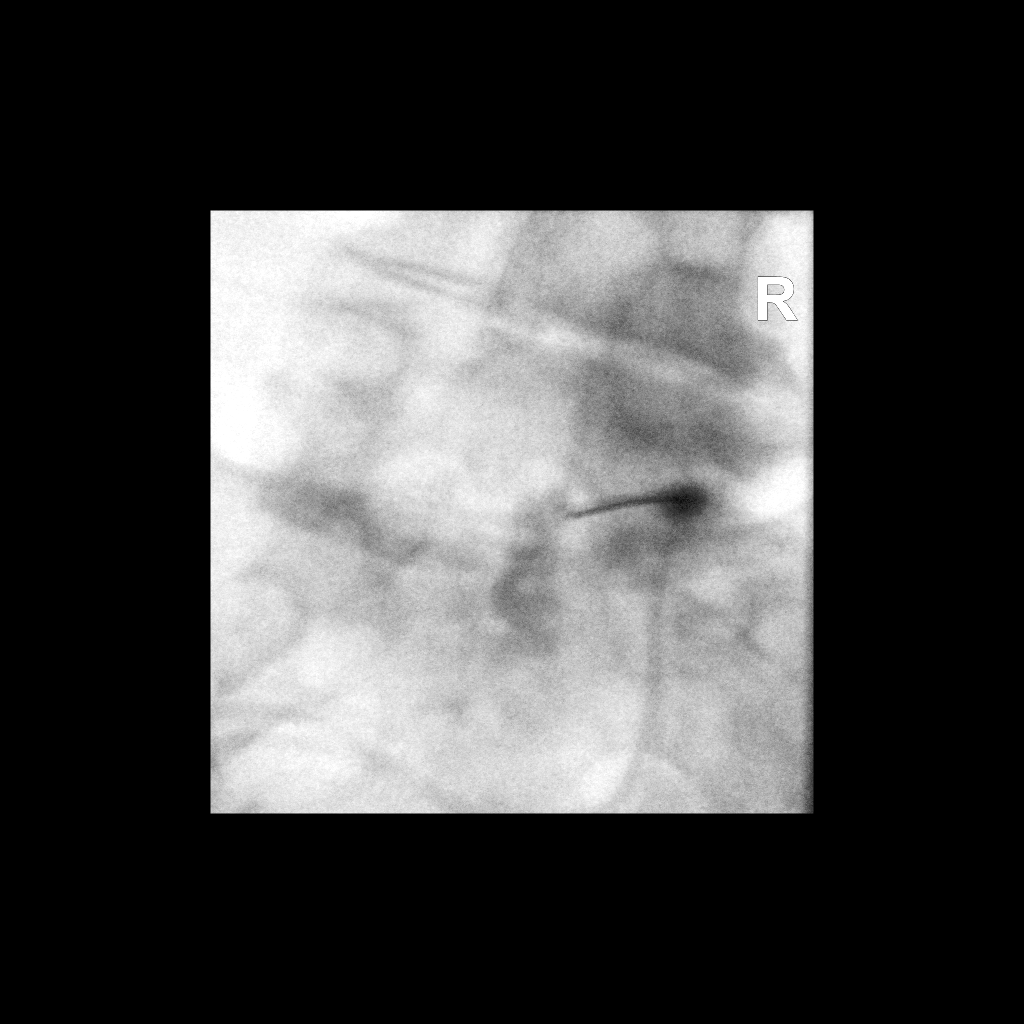

[1 of 1 positions shown; findings below may reference images not displayed]

FLUOROSCOPY TIME:  Radiation Exposure Index (as provided by the
fluoroscopic device): 16.42 uGy*m2

PROCEDURE:
The procedure, risks, benefits, and alternatives were explained to
the patient. Questions regarding the procedure were encouraged and
answered. The patient understands and consents to the procedure.

LUMBAR EPIDURAL INJECTION:

An interlaminar approach was performed on right at L5-S1. The
overlying skin was cleansed and anesthetized. A 20 gauge epidural
needle was advanced using loss-of-resistance technique.

DIAGNOSTIC EPIDURAL INJECTION:

Injection of Isovue-M 200 shows a good epidural pattern with spread
above and below the level of needle placement, primarily on the
right no vascular opacification is seen.

THERAPEUTIC EPIDURAL INJECTION:

120 mg of Depo-Medrol mixed with 3 mL 1% lidocaine were instilled.
The procedure was well-tolerated, and the patient was discharged
thirty minutes following the injection in good condition.

COMPLICATIONS:
None
IMPRESSION: Technically successful epidural injection on the right L5-S1 # 1

## 2020-09-05 DIAGNOSIS — M9903 Segmental and somatic dysfunction of lumbar region: Secondary | ICD-10-CM | POA: Diagnosis not present

## 2020-09-05 DIAGNOSIS — M9905 Segmental and somatic dysfunction of pelvic region: Secondary | ICD-10-CM | POA: Diagnosis not present

## 2020-09-05 DIAGNOSIS — M5418 Radiculopathy, sacral and sacrococcygeal region: Secondary | ICD-10-CM | POA: Diagnosis not present

## 2020-09-05 DIAGNOSIS — M5416 Radiculopathy, lumbar region: Secondary | ICD-10-CM | POA: Diagnosis not present

## 2020-09-06 DIAGNOSIS — M5418 Radiculopathy, sacral and sacrococcygeal region: Secondary | ICD-10-CM | POA: Diagnosis not present

## 2020-09-06 DIAGNOSIS — M9905 Segmental and somatic dysfunction of pelvic region: Secondary | ICD-10-CM | POA: Diagnosis not present

## 2020-09-06 DIAGNOSIS — M5416 Radiculopathy, lumbar region: Secondary | ICD-10-CM | POA: Diagnosis not present

## 2020-09-06 DIAGNOSIS — M9903 Segmental and somatic dysfunction of lumbar region: Secondary | ICD-10-CM | POA: Diagnosis not present

## 2020-09-10 DIAGNOSIS — M5416 Radiculopathy, lumbar region: Secondary | ICD-10-CM | POA: Diagnosis not present

## 2020-09-10 DIAGNOSIS — M9905 Segmental and somatic dysfunction of pelvic region: Secondary | ICD-10-CM | POA: Diagnosis not present

## 2020-09-10 DIAGNOSIS — M5418 Radiculopathy, sacral and sacrococcygeal region: Secondary | ICD-10-CM | POA: Diagnosis not present

## 2020-09-10 DIAGNOSIS — M9903 Segmental and somatic dysfunction of lumbar region: Secondary | ICD-10-CM | POA: Diagnosis not present

## 2020-09-11 DIAGNOSIS — M5416 Radiculopathy, lumbar region: Secondary | ICD-10-CM | POA: Diagnosis not present

## 2020-09-11 DIAGNOSIS — M5418 Radiculopathy, sacral and sacrococcygeal region: Secondary | ICD-10-CM | POA: Diagnosis not present

## 2020-09-11 DIAGNOSIS — M9905 Segmental and somatic dysfunction of pelvic region: Secondary | ICD-10-CM | POA: Diagnosis not present

## 2020-09-11 DIAGNOSIS — M9903 Segmental and somatic dysfunction of lumbar region: Secondary | ICD-10-CM | POA: Diagnosis not present

## 2020-09-17 DIAGNOSIS — M9903 Segmental and somatic dysfunction of lumbar region: Secondary | ICD-10-CM | POA: Diagnosis not present

## 2020-09-17 DIAGNOSIS — M5418 Radiculopathy, sacral and sacrococcygeal region: Secondary | ICD-10-CM | POA: Diagnosis not present

## 2020-09-17 DIAGNOSIS — M5416 Radiculopathy, lumbar region: Secondary | ICD-10-CM | POA: Diagnosis not present

## 2020-09-17 DIAGNOSIS — M9905 Segmental and somatic dysfunction of pelvic region: Secondary | ICD-10-CM | POA: Diagnosis not present

## 2020-09-20 DIAGNOSIS — M9905 Segmental and somatic dysfunction of pelvic region: Secondary | ICD-10-CM | POA: Diagnosis not present

## 2020-09-20 DIAGNOSIS — M5418 Radiculopathy, sacral and sacrococcygeal region: Secondary | ICD-10-CM | POA: Diagnosis not present

## 2020-09-20 DIAGNOSIS — M5416 Radiculopathy, lumbar region: Secondary | ICD-10-CM | POA: Diagnosis not present

## 2020-09-20 DIAGNOSIS — M9903 Segmental and somatic dysfunction of lumbar region: Secondary | ICD-10-CM | POA: Diagnosis not present

## 2020-09-27 DIAGNOSIS — M5416 Radiculopathy, lumbar region: Secondary | ICD-10-CM | POA: Diagnosis not present

## 2020-09-27 DIAGNOSIS — M9903 Segmental and somatic dysfunction of lumbar region: Secondary | ICD-10-CM | POA: Diagnosis not present

## 2020-09-27 DIAGNOSIS — M5418 Radiculopathy, sacral and sacrococcygeal region: Secondary | ICD-10-CM | POA: Diagnosis not present

## 2020-09-27 DIAGNOSIS — M9905 Segmental and somatic dysfunction of pelvic region: Secondary | ICD-10-CM | POA: Diagnosis not present

## 2020-10-04 DIAGNOSIS — M5416 Radiculopathy, lumbar region: Secondary | ICD-10-CM | POA: Diagnosis not present

## 2020-10-04 DIAGNOSIS — M9903 Segmental and somatic dysfunction of lumbar region: Secondary | ICD-10-CM | POA: Diagnosis not present

## 2020-10-04 DIAGNOSIS — M9905 Segmental and somatic dysfunction of pelvic region: Secondary | ICD-10-CM | POA: Diagnosis not present

## 2020-10-04 DIAGNOSIS — M5418 Radiculopathy, sacral and sacrococcygeal region: Secondary | ICD-10-CM | POA: Diagnosis not present

## 2020-10-11 DIAGNOSIS — M9903 Segmental and somatic dysfunction of lumbar region: Secondary | ICD-10-CM | POA: Diagnosis not present

## 2020-10-11 DIAGNOSIS — M5418 Radiculopathy, sacral and sacrococcygeal region: Secondary | ICD-10-CM | POA: Diagnosis not present

## 2020-10-11 DIAGNOSIS — M9905 Segmental and somatic dysfunction of pelvic region: Secondary | ICD-10-CM | POA: Diagnosis not present

## 2020-10-11 DIAGNOSIS — M5416 Radiculopathy, lumbar region: Secondary | ICD-10-CM | POA: Diagnosis not present

## 2020-10-17 ENCOUNTER — Ambulatory Visit (INDEPENDENT_AMBULATORY_CARE_PROVIDER_SITE_OTHER): Payer: Medicare Other | Admitting: Surgery

## 2020-10-17 ENCOUNTER — Ambulatory Visit
Admission: RE | Admit: 2020-10-17 | Discharge: 2020-10-17 | Disposition: A | Discharge status: Home / Self Care | Payer: Medicare Other | Source: Ambulatory Visit | Attending: Surgery | Admitting: Surgery

## 2020-10-17 ENCOUNTER — Other Ambulatory Visit: Payer: Self-pay

## 2020-10-17 VITALS — BP 121/68 | HR 85 | Resp 20 | Ht 61.0 in | Wt 178.0 lb

## 2020-10-17 DIAGNOSIS — R911 Solitary pulmonary nodule: Secondary | ICD-10-CM

## 2020-10-17 DIAGNOSIS — Z862 Personal history of diseases of the blood and blood-forming organs and certain disorders involving the immune mechanism: Secondary | ICD-10-CM | POA: Diagnosis not present

## 2020-10-17 DIAGNOSIS — J45909 Unspecified asthma, uncomplicated: Secondary | ICD-10-CM | POA: Diagnosis not present

## 2020-10-17 DIAGNOSIS — I700 Atherosclerosis of aorta: Secondary | ICD-10-CM | POA: Diagnosis not present

## 2020-10-18 DIAGNOSIS — M5416 Radiculopathy, lumbar region: Secondary | ICD-10-CM | POA: Diagnosis not present

## 2020-10-18 DIAGNOSIS — M5418 Radiculopathy, sacral and sacrococcygeal region: Secondary | ICD-10-CM | POA: Diagnosis not present

## 2020-10-18 DIAGNOSIS — M9905 Segmental and somatic dysfunction of pelvic region: Secondary | ICD-10-CM | POA: Diagnosis not present

## 2020-10-18 DIAGNOSIS — M9903 Segmental and somatic dysfunction of lumbar region: Secondary | ICD-10-CM | POA: Diagnosis not present

## 2020-10-19 ENCOUNTER — Encounter: Payer: Self-pay | Admitting: Surgery

## 2020-10-19 NOTE — Progress Notes (Signed)
HPI:  The patient returns today for follow-up of a 1.5 x 1.1 cm macrolobulated nodule in the right lower lobe of lung noted on pre-TAVR CT scan.  She underwent TAVR on 12/07/2018 and had an uncomplicated postoperative course.  She has continued to feel well since her TAVR.  Current Outpatient Medications  Medication Sig Dispense Refill   aspirin 81 MG chewable tablet Chew 1 tablet (81 mg total) by mouth daily. 90 tablet 3   Calcium Carb-Cholecalciferol (CALCIUM+D3 PO) Take 1 tablet by mouth daily.     clobetasol ointment (TEMOVATE) [id number] % Apply 1 application topically daily as needed.     furosemide (LASIX) 20 MG tablet Take 20 mg by mouth daily.     lisinopril (ZESTRIL) 40 MG tablet Take 40 mg by mouth daily.     meloxicam (MOBIC) 15 MG tablet Take 15 mg by mouth daily.     rosuvastatin (CRESTOR) 20 MG tablet Take 20 mg by mouth every evening.      No current facility-administered medications for this visit.     Physical Exam: BP 121/68   Pulse 85   Resp 20   Ht '5\' 1"'$  (1.549 m)   Wt 178 lb (80.7 kg)   LMP  (LMP Unknown)   SpO2 94% Comment: RA  BMI 33.63 kg/m  She looks well. Cardiac exam shows a regular rate and rhythm with a 2/6 systolic murmur along the right sternal border. Lungs are clear. There is no peripheral edema.  Diagnostic Tests:  Narrative & Impression  CLINICAL DATA:  Pulmonary nodule.   EXAM: CT CHEST WITHOUT CONTRAST   TECHNIQUE: Multidetector CT imaging of the chest was performed following the standard protocol without IV contrast.   COMPARISON:  10/12/2019   FINDINGS: Cardiovascular: The heart size is normal. No substantial pericardial effusion. Coronary artery calcification is evident. Status post TAVR. Moderate atherosclerotic calcification is noted in the wall of the thoracic aorta.   Mediastinum/Nodes: No mediastinal lymphadenopathy. No evidence for gross hilar lymphadenopathy although assessment is limited by the lack of intravenous  contrast on the current study. The esophagus has normal imaging features. There is no axillary lymphadenopathy.   Lungs/Pleura: Right lower lobe pulmonary nodule of concern is stable at 1.4 x 1.1 cm today compared to 1.4 x 1.1 cm previously. 7 mm nodule in the superior segment right lower lobe (43/14) is new in the interval with subtle surrounding ground-glass opacity (39/14). No suspicious pulmonary nodule or mass in the left lung. No pleural effusion.   Upper Abdomen: 2.0 cm exophytic low-density lesion upper pole left kidney is stable in size in the interval, likely a cyst. Exophytic lesion upper pole right kidney has been incompletely visualized measuring at least 2.6 cm. This is not substantially changed from CTA study dated 11/29/2018 in approaches water attenuation compatible with a cyst. Scattered diverticuli are seen in the splenic flexure of the colon.   Musculoskeletal: No worrisome lytic or sclerotic osseous abnormality.   IMPRESSION: 1. Right lower lobe pulmonary nodule of concern is stable at 1.4 cm. Given interval stability, internal stippled calcification, and lack of hypermetabolism on PET-CT of 04/06/2019, this is most likely a benign finding. 2. Interval development of a 7 mm superior segment right lower lobe pulmonary nodule in an area of subtle ground-glass opacity. Likely infectious/inflammatory, follow-up CT chest in 3 months could be used to ensure resolution. 3. Aortic Atherosclerosis (ICD10-I70.0).     Electronically Signed   By: Misty Stanley M.D.   On:  10/17/2020 12:30      Impression:  She has a stable 1.4 cm right lower lobe pulmonary nodule that has some internal stippled calcification and no hypermetabolism on PET/CT.  This is felt to be most likely benign.  There has been interval development of a 7 mm superior segment right lower lobe pulmonary nodule and an area of subtle groundglass opacity.  This is most likely infectious/inflammatory but  will require a follow-up CT scan of the chest and 6 months to reassess this.  I reviewed the CT scan images with the patient and answered all of her questions.  Plan:  She will return to see me in 6 months with a CT scan of the chest.  I spent 15 minutes performing this established patient evaluation and > 50% of this time was spent face to face counseling and coordinating the surveillance of her right lower lobe pulmonary nodule.   [name], MD [facility] [phone]

## 2020-10-25 DIAGNOSIS — M9903 Segmental and somatic dysfunction of lumbar region: Secondary | ICD-10-CM | POA: Diagnosis not present

## 2020-10-25 DIAGNOSIS — M5416 Radiculopathy, lumbar region: Secondary | ICD-10-CM | POA: Diagnosis not present

## 2020-10-25 DIAGNOSIS — M9905 Segmental and somatic dysfunction of pelvic region: Secondary | ICD-10-CM | POA: Diagnosis not present

## 2020-10-25 DIAGNOSIS — M5418 Radiculopathy, sacral and sacrococcygeal region: Secondary | ICD-10-CM | POA: Diagnosis not present

## 2020-10-29 DIAGNOSIS — Z1231 Encounter for screening mammogram for malignant neoplasm of breast: Secondary | ICD-10-CM | POA: Diagnosis not present

## 2020-11-01 DIAGNOSIS — M9903 Segmental and somatic dysfunction of lumbar region: Secondary | ICD-10-CM | POA: Diagnosis not present

## 2020-11-01 DIAGNOSIS — M5416 Radiculopathy, lumbar region: Secondary | ICD-10-CM | POA: Diagnosis not present

## 2020-11-01 DIAGNOSIS — M9905 Segmental and somatic dysfunction of pelvic region: Secondary | ICD-10-CM | POA: Diagnosis not present

## 2020-11-01 DIAGNOSIS — M5418 Radiculopathy, sacral and sacrococcygeal region: Secondary | ICD-10-CM | POA: Diagnosis not present

## 2020-11-07 DIAGNOSIS — I700 Atherosclerosis of aorta: Secondary | ICD-10-CM | POA: Diagnosis not present

## 2020-11-07 DIAGNOSIS — R928 Other abnormal and inconclusive findings on diagnostic imaging of breast: Secondary | ICD-10-CM | POA: Diagnosis not present

## 2020-11-07 DIAGNOSIS — M545 Low back pain, unspecified: Secondary | ICD-10-CM | POA: Diagnosis not present

## 2020-11-07 DIAGNOSIS — Z299 Encounter for prophylactic measures, unspecified: Secondary | ICD-10-CM | POA: Diagnosis not present

## 2020-11-07 DIAGNOSIS — I272 Pulmonary hypertension, unspecified: Secondary | ICD-10-CM | POA: Diagnosis not present

## 2020-11-07 DIAGNOSIS — I10 Essential (primary) hypertension: Secondary | ICD-10-CM | POA: Diagnosis not present

## 2020-11-07 DIAGNOSIS — N6002 Solitary cyst of left breast: Secondary | ICD-10-CM | POA: Diagnosis not present

## 2020-11-13 DIAGNOSIS — M79675 Pain in left toe(s): Secondary | ICD-10-CM | POA: Diagnosis not present

## 2020-11-13 DIAGNOSIS — L723 Sebaceous cyst: Secondary | ICD-10-CM | POA: Diagnosis not present

## 2020-11-13 DIAGNOSIS — L602 Onychogryphosis: Secondary | ICD-10-CM | POA: Diagnosis not present

## 2020-11-13 DIAGNOSIS — M79674 Pain in right toe(s): Secondary | ICD-10-CM | POA: Diagnosis not present

## 2020-11-13 DIAGNOSIS — M79671 Pain in right foot: Secondary | ICD-10-CM | POA: Diagnosis not present

## 2020-11-13 DIAGNOSIS — B351 Tinea unguium: Secondary | ICD-10-CM | POA: Diagnosis not present

## 2020-11-13 DIAGNOSIS — L600 Ingrowing nail: Secondary | ICD-10-CM | POA: Diagnosis not present

## 2020-12-28 DIAGNOSIS — I10 Essential (primary) hypertension: Secondary | ICD-10-CM | POA: Diagnosis not present

## 2020-12-28 DIAGNOSIS — Z7189 Other specified counseling: Secondary | ICD-10-CM | POA: Diagnosis not present

## 2020-12-28 DIAGNOSIS — Z2821 Immunization not carried out because of patient refusal: Secondary | ICD-10-CM | POA: Diagnosis not present

## 2020-12-28 DIAGNOSIS — M545 Low back pain, unspecified: Secondary | ICD-10-CM | POA: Diagnosis not present

## 2020-12-28 DIAGNOSIS — Z299 Encounter for prophylactic measures, unspecified: Secondary | ICD-10-CM | POA: Diagnosis not present

## 2020-12-28 DIAGNOSIS — I5032 Chronic diastolic (congestive) heart failure: Secondary | ICD-10-CM | POA: Diagnosis not present

## 2020-12-28 DIAGNOSIS — Z6834 Body mass index (BMI) 34.0-34.9, adult: Secondary | ICD-10-CM | POA: Diagnosis not present

## 2020-12-28 DIAGNOSIS — E7800 Pure hypercholesterolemia, unspecified: Secondary | ICD-10-CM | POA: Diagnosis not present

## 2020-12-28 DIAGNOSIS — Z0000 Encounter for general adult medical examination without abnormal findings: Secondary | ICD-10-CM | POA: Diagnosis not present

## 2020-12-28 DIAGNOSIS — Z1339 Encounter for screening examination for other mental health and behavioral disorders: Secondary | ICD-10-CM | POA: Diagnosis not present

## 2020-12-28 DIAGNOSIS — R5383 Other fatigue: Secondary | ICD-10-CM | POA: Diagnosis not present

## 2020-12-28 DIAGNOSIS — Z79899 Other long term (current) drug therapy: Secondary | ICD-10-CM | POA: Diagnosis not present

## 2020-12-28 DIAGNOSIS — Z1331 Encounter for screening for depression: Secondary | ICD-10-CM | POA: Diagnosis not present

## 2021-01-02 IMAGING — CT CT CHEST W/O CM
2 of 4 series · 12 of 36 positions shown, 15 images · non-contrast
Comparison: PET 04/06/2019 and CT chest 11/29/2018.

CLINICAL DATA: Lung nodule.  Aortic valve replacement.

EXAM:
CT CHEST WITHOUT CONTRAST
TECHNIQUE: Multidetector CT imaging of the chest was performed following the
standard protocol without IV contrast.

[Series 2: chest 2.00 br40 s3 · axial · 0.65mm/px · z∈[+1599,+1840]mm · 9 of 144 slices shown, 12 images (1 of 2)]
[im 12/144  mediastinal]
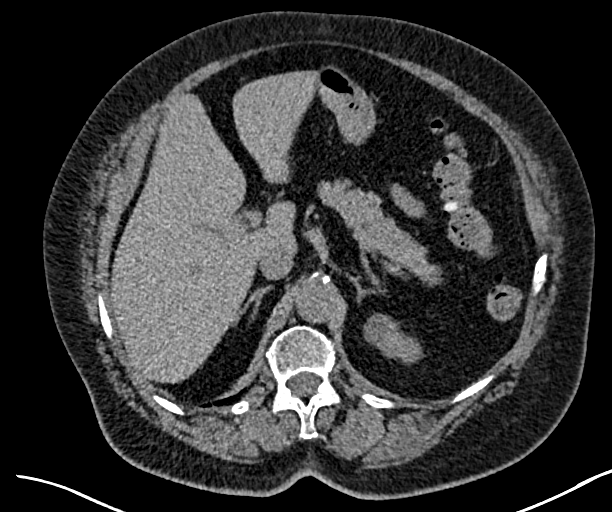
[im 12/144  lung]
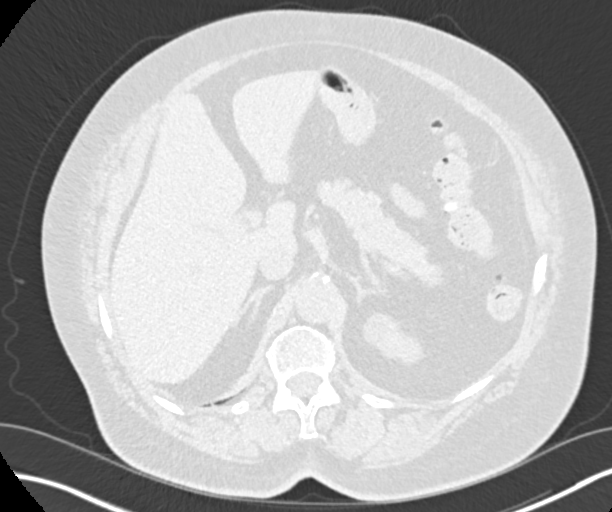
[im 34/144  lung]
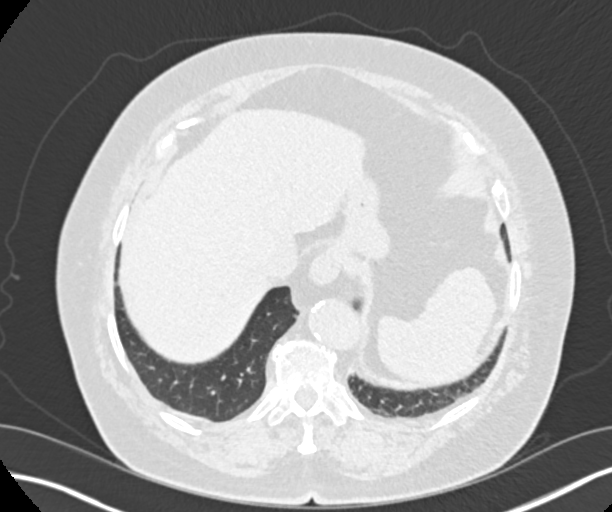
[im 45/144  lung]
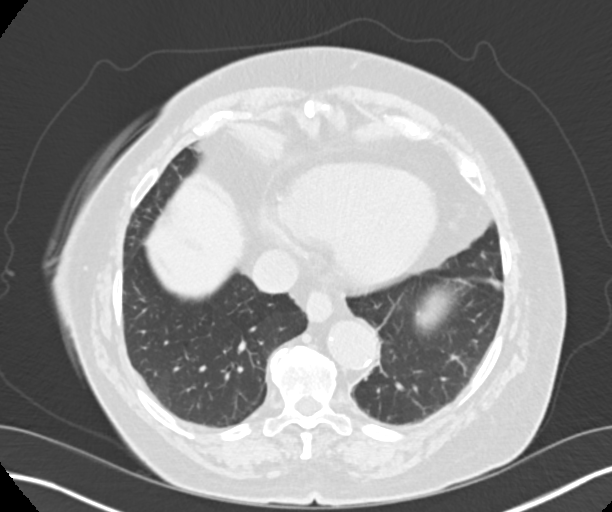
[im 56/144  lung]
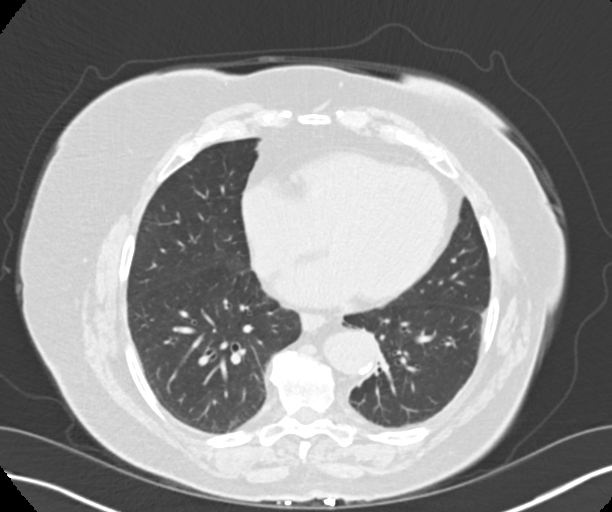
[im 78/144  mediastinal]
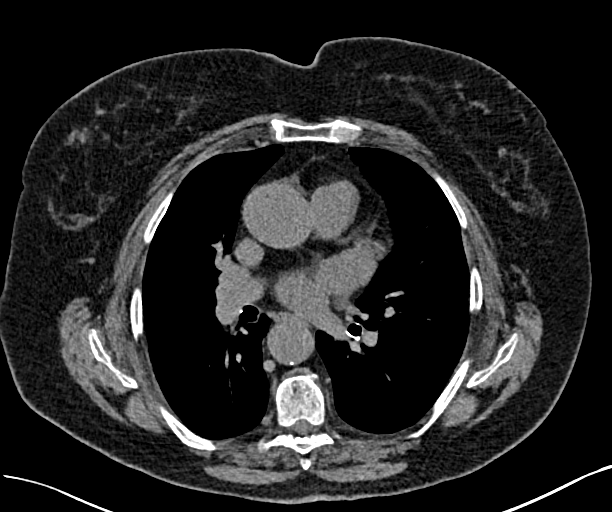
[im 78/144  lung]
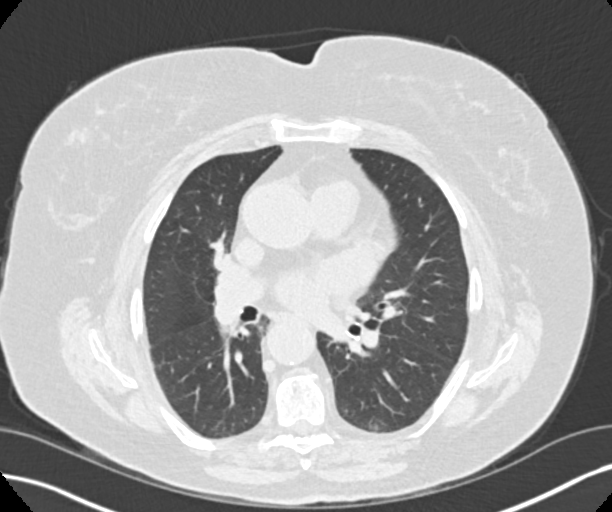
[im 89/144  lung]
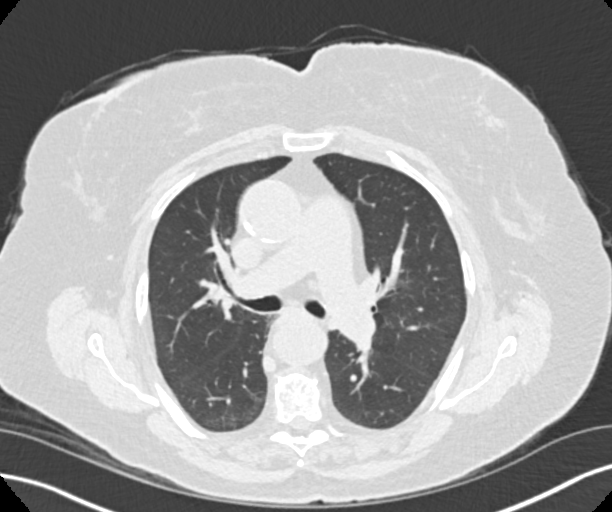
[im 100/144  lung]
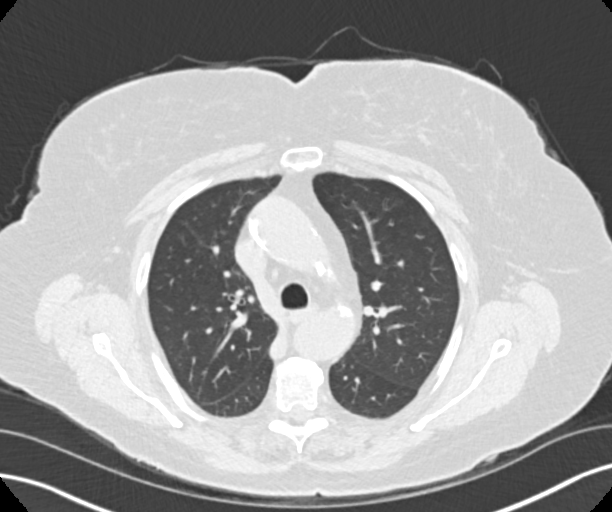
[im 122/144  lung]
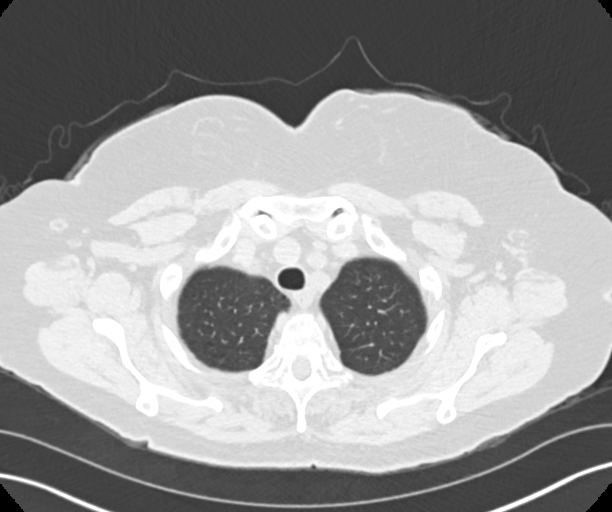
[im 133/144  mediastinal]
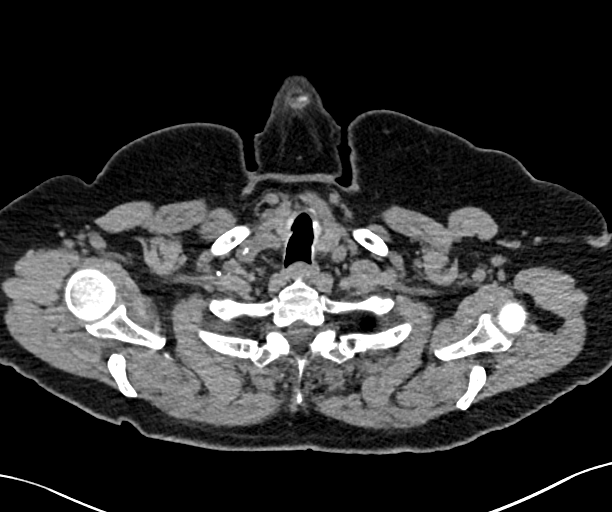
[im 133/144  lung]
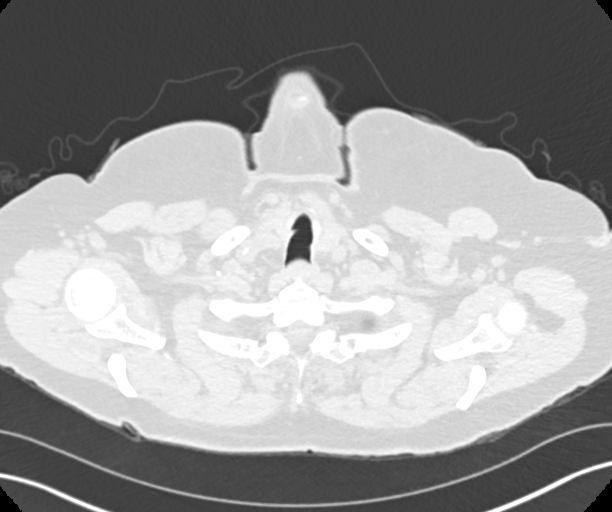

[Series 4: chest 2.00 br40 s3 · coronal · 0.56mm/px · 3 of 166 slices shown (2 of 2)]
[im 34/166  lung]
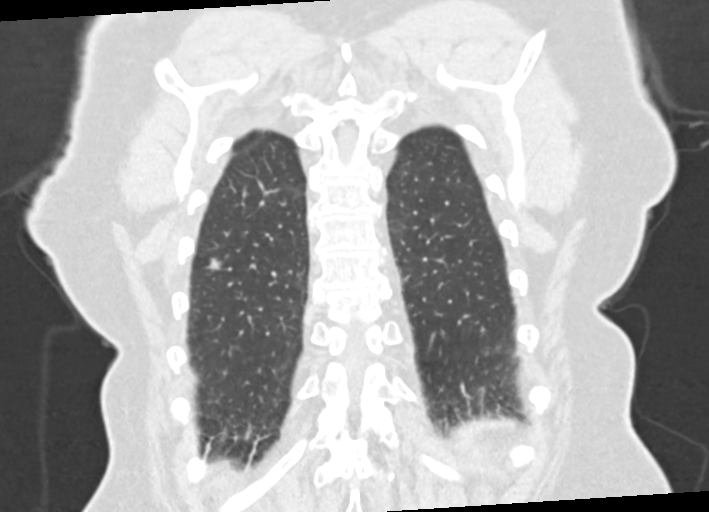
[im 67/166  lung]
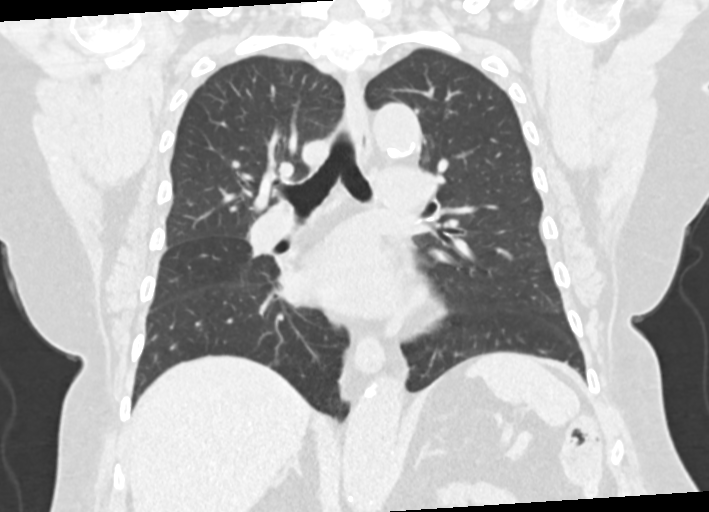
[im 100/166  lung]
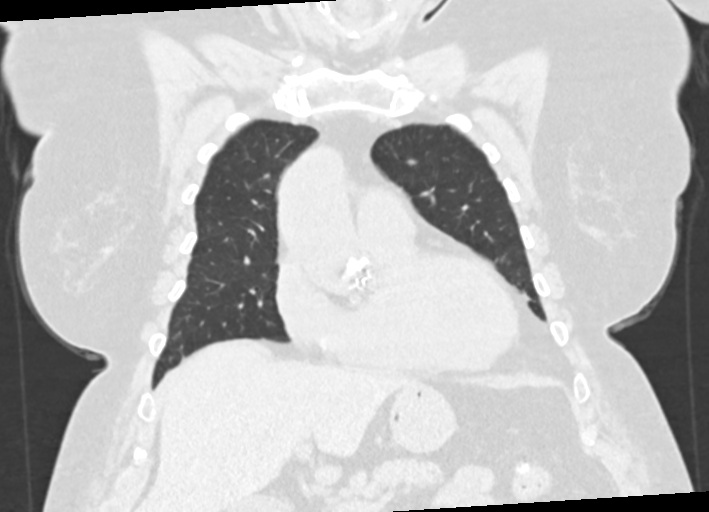

[12 of 36 positions shown; findings below may reference images not displayed]

FINDINGS: Cardiovascular: Atherosclerotic calcification of the aorta and
coronary arteries. Aortic valve replacement. Ascending aorta
measures 4.0 cm. Heart is mildly enlarged. No pericardial effusion.

Mediastinum/Nodes: 1.4 cm low-attenuation right thyroid nodule. No
follow-up recommended (ref: [HOSPITAL]. [DATE]):
143-50).No pathologically enlarged mediastinal or axillary lymph
nodes. Hilar regions are difficult to definitively evaluate without
IV contrast but appear grossly unremarkable. Esophagus is grossly
unremarkable.

Lungs/Pleura: Mild centrilobular emphysema. Residual smoking related
respiratory bronchiolitis. 4 mm right upper lobe nodule (8/47),
stable and likely benign. 1.1 x 1.4 cm nodule in the superior
segment right lower lobe (8/75), unchanged from 11/29/2018. There is
internal calcification. Minimal dependent atelectasis bilaterally.
Lungs are otherwise clear. No pleural fluid. Airway is unremarkable.

Upper Abdomen: Subcentimeter low-attenuation lesion in the left
hepatic lobe is too small to characterize. Visualized portions of
the liver, gallbladder and adrenal glands are unremarkable.
Low-attenuation lesions in the left kidney measure up to 2.1 cm and
are likely cysts although definitive characterization is limited
without post-contrast imaging. Spleen and visualized portions of the
pancreas, stomach and bowel are grossly unremarkable. No upper
abdominal adenopathy.

Musculoskeletal: Degenerative changes in the spine. No worrisome
lytic or sclerotic lesions.
IMPRESSION: 1. Right lower lobe nodule is unchanged from 11/29/2018 and has
internal calcification. Associated hypo metabolism on PET
04/06/2019. Collectively, findings favor a benign lesion. Additional
follow-up in 1 year is recommended to ensure continued stability and
exclude adenocarcinoma.
2. Ascending Aortic aneurysm NOS (63SYV-YWO.H), stable, status post
TAVR.
3. Aortic atherosclerosis (63SYV-HVT.T). Coronary artery
calcification.
4.  Emphysema (63SYV-QOI.U).

## 2021-01-09 DIAGNOSIS — M4807 Spinal stenosis, lumbosacral region: Secondary | ICD-10-CM | POA: Diagnosis not present

## 2021-01-09 DIAGNOSIS — M549 Dorsalgia, unspecified: Secondary | ICD-10-CM | POA: Diagnosis not present

## 2021-01-09 DIAGNOSIS — M48061 Spinal stenosis, lumbar region without neurogenic claudication: Secondary | ICD-10-CM | POA: Diagnosis not present

## 2021-01-22 DIAGNOSIS — R600 Localized edema: Secondary | ICD-10-CM | POA: Diagnosis not present

## 2021-01-22 DIAGNOSIS — Z299 Encounter for prophylactic measures, unspecified: Secondary | ICD-10-CM | POA: Diagnosis not present

## 2021-01-22 DIAGNOSIS — I10 Essential (primary) hypertension: Secondary | ICD-10-CM | POA: Diagnosis not present

## 2021-01-22 DIAGNOSIS — R252 Cramp and spasm: Secondary | ICD-10-CM | POA: Diagnosis not present

## 2021-01-22 DIAGNOSIS — M545 Low back pain, unspecified: Secondary | ICD-10-CM | POA: Diagnosis not present

## 2021-02-19 DIAGNOSIS — M79674 Pain in right toe(s): Secondary | ICD-10-CM | POA: Diagnosis not present

## 2021-02-19 DIAGNOSIS — L600 Ingrowing nail: Secondary | ICD-10-CM | POA: Diagnosis not present

## 2021-02-19 DIAGNOSIS — B351 Tinea unguium: Secondary | ICD-10-CM | POA: Diagnosis not present

## 2021-02-19 DIAGNOSIS — M79675 Pain in left toe(s): Secondary | ICD-10-CM | POA: Diagnosis not present

## 2021-02-19 DIAGNOSIS — L723 Sebaceous cyst: Secondary | ICD-10-CM | POA: Diagnosis not present

## 2021-02-19 DIAGNOSIS — L602 Onychogryphosis: Secondary | ICD-10-CM | POA: Diagnosis not present

## 2021-02-19 DIAGNOSIS — M2041 Other hammer toe(s) (acquired), right foot: Secondary | ICD-10-CM | POA: Diagnosis not present

## 2021-02-26 DIAGNOSIS — Z79891 Long term (current) use of opiate analgesic: Secondary | ICD-10-CM | POA: Diagnosis not present

## 2021-02-26 DIAGNOSIS — G894 Chronic pain syndrome: Secondary | ICD-10-CM | POA: Diagnosis not present

## 2021-02-26 DIAGNOSIS — M545 Low back pain, unspecified: Secondary | ICD-10-CM | POA: Diagnosis not present

## 2021-02-26 DIAGNOSIS — M48061 Spinal stenosis, lumbar region without neurogenic claudication: Secondary | ICD-10-CM | POA: Diagnosis not present

## 2021-02-26 DIAGNOSIS — Z79899 Other long term (current) drug therapy: Secondary | ICD-10-CM | POA: Diagnosis not present

## 2021-03-06 ENCOUNTER — Other Ambulatory Visit: Payer: Self-pay | Admitting: Surgery

## 2021-03-06 DIAGNOSIS — R911 Solitary pulmonary nodule: Secondary | ICD-10-CM

## 2021-03-19 DIAGNOSIS — I10 Essential (primary) hypertension: Secondary | ICD-10-CM | POA: Diagnosis not present

## 2021-03-19 DIAGNOSIS — E782 Mixed hyperlipidemia: Secondary | ICD-10-CM | POA: Diagnosis not present

## 2021-04-10 ENCOUNTER — Ambulatory Visit
Admission: RE | Admit: 2021-04-10 | Discharge: 2021-04-10 | Disposition: A | Discharge status: Home / Self Care | Payer: Medicare Other | Source: Ambulatory Visit | Attending: Surgery | Admitting: Surgery

## 2021-04-10 ENCOUNTER — Ambulatory Visit (INDEPENDENT_AMBULATORY_CARE_PROVIDER_SITE_OTHER): Payer: Medicare Other | Admitting: Surgery

## 2021-04-10 ENCOUNTER — Other Ambulatory Visit: Payer: Self-pay

## 2021-04-10 VITALS — BP 160/84 | HR 62 | Resp 20 | Ht 61.0 in | Wt 184.0 lb

## 2021-04-10 DIAGNOSIS — R911 Solitary pulmonary nodule: Secondary | ICD-10-CM | POA: Diagnosis not present

## 2021-04-11 NOTE — Progress Notes (Signed)
HPI:  The patient is an 81 year old woman who was noted to have a 1.5 x 1.1 cm macrolobulated nodule in the right lower lobe of the lung on a pre-TAVR CT scan.  She underwent TAVR on 12/07/2018 and has done well from that.  I last saw her on 10/17/2020 and this right lower lobe nodule had some internal stippled calcification and no hypermetabolism on PET/CT.  It was felt to be benign.  She had interval development of a 7 mm superior segment right lower lobe pulmonary nodule with an area of subtle groundglass opacity.  It was felt that this was most likely infectious/inflammatory but requires CT scan follow-up in 6 months.  She continues to feel well.  She denies any chest pain or shortness of breath.  She has had no cough, sputum production, or hemoptysis.  Current Outpatient Medications  Medication Sig Dispense Refill   aspirin 81 MG chewable tablet Chew 1 tablet (81 mg total) by mouth daily. 90 tablet 3   Calcium Carb-Cholecalciferol (CALCIUM+D3 PO) Take 1 tablet by mouth daily.     clobetasol ointment (TEMOVATE) 6.75 % Apply 1 application topically daily as needed.     furosemide (LASIX) 20 MG tablet Take 20 mg by mouth daily.     lisinopril (ZESTRIL) 40 MG tablet Take 40 mg by mouth daily.     meloxicam (MOBIC) 15 MG tablet Take 15 mg by mouth daily.     rosuvastatin (CRESTOR) 20 MG tablet Take 20 mg by mouth every evening.      No current facility-administered medications for this visit.     Physical Exam: BP (!) 160/84    Pulse 62    Resp 20    Ht 5\' 1"  (1.549 m)    Wt 184 lb (83.5 kg)    LMP  (LMP Unknown)    SpO2 95% Comment: RA   BMI 34.77 kg/m  She looks well. Cardiac exam shows a regular rate and rhythm with a 2/6 systolic murmur along the right sternal border.  This is unchanged from her previous exam.  There is no diastolic murmur Lungs are clear. There is no peripheral edema.  Diagnostic Tests:  Narrative & Impression  CLINICAL DATA:  Lung nodule, follow-up, prior AVR,  chronic kidney disease, CHF, hypertension, former smoker   EXAM: CT CHEST WITHOUT CONTRAST   TECHNIQUE: Multidetector CT imaging of the chest was performed following the standard protocol without IV contrast.   RADIATION DOSE REDUCTION: This exam was performed according to the departmental dose-optimization program which includes automated exposure control, adjustment of the mA and/or kV according to patient size and/or use of iterative reconstruction technique.   COMPARISON:  10/17/2020   FINDINGS: Cardiovascular: Post TAVR. Ascending thoracic aorta upper normal caliber. Heart otherwise unremarkable. No pericardial effusion.   Mediastinum/Nodes: Esophagus unremarkable. Base of cervical region normal appearance. 8 mm LEFT breast nodule image 53 slightly increased in size. No thoracic adenopathy.   Lungs/Pleura: Calcified granuloma RIGHT upper lobe image 72 unchanged. Partially calcified nodule RIGHT lower lobe 15 x 11 mm image 70 unchanged. Tiny nodule LEFT upper lobe image 29 unchanged. Infiltrate at superior aspect of superior segment RIGHT lower lobe, little changed. Nodular focus at this site seen on the previous exam 6-7 mm diameter no longer discretely visualized. No additional pulmonary infiltrate, pleural effusion,   Upper Abdomen: Small cyst at the upper poles of both kidneys. Colonic diverticulosis without evidence of diverticulitis. Remaining visualized upper abdomen unremarkable.   Musculoskeletal: No acute  osseous findings.   IMPRESSION: Old granulomatous disease.   Stable appearance of RIGHT lower lobe infiltrate.   No new intrathoracic abnormalities.   Colonic diverticulosis.   Nodular focus superior segment RIGHT lower lobe on the previous exam measuring 6-7 mm diameter is no longer discretely visualized.   8 mm nodule LEFT breast, slightly increased; correlation with mammography recommended, and potentially ultrasound as clinically indicated.    Aortic Atherosclerosis (ICD10-I70.0).     Electronically Signed   By: Lavonia Dana M.D.   On: 04/10/2021 12:45      Impression:  The right lower lobe nodule with stippled calcification is unchanged.  This is most likely benign.  The nodular focus seen in the superior segment of the right lower lobe that measured 6 to 7 mm previously is no longer discretely visualized.  The infiltrate in this area is unchanged.  I suspect this is infectious/inflammatory.  Radiology also commented on an 8 mm nodule in the left breast that they felt was slightly increased.  It was present on previous CT scans and is unchanged in size to my measurement.  It was visible on her PET scan but did not have hypermetabolic activity.  I reviewed her CT and PET images with her and answered all of her questions.  Plan:  I do not think there is any indication for follow-up CT scans in this patient.  She will continue to follow-up with her PCP and cardiology.  I spent 20 minutes performing this established patient evaluation and > 50% of this time was spent face to face counseling and coordinating the care of this patient's aortic aneurysm.    [name], MD [facility] [phone]

## 2021-05-21 DIAGNOSIS — L723 Sebaceous cyst: Secondary | ICD-10-CM | POA: Diagnosis not present

## 2021-05-21 DIAGNOSIS — L600 Ingrowing nail: Secondary | ICD-10-CM | POA: Diagnosis not present

## 2021-05-21 DIAGNOSIS — M79675 Pain in left toe(s): Secondary | ICD-10-CM | POA: Diagnosis not present

## 2021-05-21 DIAGNOSIS — M79674 Pain in right toe(s): Secondary | ICD-10-CM | POA: Diagnosis not present

## 2021-05-21 DIAGNOSIS — B351 Tinea unguium: Secondary | ICD-10-CM | POA: Diagnosis not present

## 2021-05-21 DIAGNOSIS — M79671 Pain in right foot: Secondary | ICD-10-CM | POA: Diagnosis not present

## 2021-05-21 DIAGNOSIS — L602 Onychogryphosis: Secondary | ICD-10-CM | POA: Diagnosis not present

## 2021-07-17 DIAGNOSIS — Z862 Personal history of diseases of the blood and blood-forming organs and certain disorders involving the immune mechanism: Secondary | ICD-10-CM | POA: Diagnosis not present

## 2021-07-17 DIAGNOSIS — J45909 Unspecified asthma, uncomplicated: Secondary | ICD-10-CM | POA: Diagnosis not present

## 2021-09-05 DIAGNOSIS — L602 Onychogryphosis: Secondary | ICD-10-CM | POA: Diagnosis not present

## 2021-09-05 DIAGNOSIS — L723 Sebaceous cyst: Secondary | ICD-10-CM | POA: Diagnosis not present

## 2021-09-05 DIAGNOSIS — M79675 Pain in left toe(s): Secondary | ICD-10-CM | POA: Diagnosis not present

## 2021-09-05 DIAGNOSIS — L600 Ingrowing nail: Secondary | ICD-10-CM | POA: Diagnosis not present

## 2021-09-05 DIAGNOSIS — M79674 Pain in right toe(s): Secondary | ICD-10-CM | POA: Diagnosis not present

## 2021-09-05 DIAGNOSIS — B351 Tinea unguium: Secondary | ICD-10-CM | POA: Diagnosis not present

## 2021-09-20 DIAGNOSIS — Z299 Encounter for prophylactic measures, unspecified: Secondary | ICD-10-CM | POA: Diagnosis not present

## 2021-09-20 DIAGNOSIS — M545 Low back pain, unspecified: Secondary | ICD-10-CM | POA: Diagnosis not present

## 2021-09-20 DIAGNOSIS — I10 Essential (primary) hypertension: Secondary | ICD-10-CM | POA: Diagnosis not present

## 2021-09-20 DIAGNOSIS — Z713 Dietary counseling and surveillance: Secondary | ICD-10-CM | POA: Diagnosis not present

## 2021-09-20 DIAGNOSIS — Z6833 Body mass index (BMI) 33.0-33.9, adult: Secondary | ICD-10-CM | POA: Diagnosis not present

## 2021-10-31 DIAGNOSIS — Z1231 Encounter for screening mammogram for malignant neoplasm of breast: Secondary | ICD-10-CM | POA: Diagnosis not present

## 2021-11-09 DIAGNOSIS — R309 Painful micturition, unspecified: Secondary | ICD-10-CM | POA: Diagnosis not present

## 2021-11-09 DIAGNOSIS — I10 Essential (primary) hypertension: Secondary | ICD-10-CM | POA: Diagnosis not present

## 2021-11-09 DIAGNOSIS — N390 Urinary tract infection, site not specified: Secondary | ICD-10-CM | POA: Diagnosis not present

## 2021-11-11 DIAGNOSIS — I10 Essential (primary) hypertension: Secondary | ICD-10-CM | POA: Diagnosis not present

## 2021-11-11 DIAGNOSIS — Z299 Encounter for prophylactic measures, unspecified: Secondary | ICD-10-CM | POA: Diagnosis not present

## 2021-11-11 DIAGNOSIS — N1832 Chronic kidney disease, stage 3b: Secondary | ICD-10-CM | POA: Diagnosis not present

## 2021-11-11 DIAGNOSIS — I272 Pulmonary hypertension, unspecified: Secondary | ICD-10-CM | POA: Diagnosis not present

## 2021-11-11 DIAGNOSIS — I5032 Chronic diastolic (congestive) heart failure: Secondary | ICD-10-CM | POA: Diagnosis not present

## 2021-12-05 DIAGNOSIS — B351 Tinea unguium: Secondary | ICD-10-CM | POA: Diagnosis not present

## 2021-12-05 DIAGNOSIS — M2041 Other hammer toe(s) (acquired), right foot: Secondary | ICD-10-CM | POA: Diagnosis not present

## 2021-12-05 DIAGNOSIS — L602 Onychogryphosis: Secondary | ICD-10-CM | POA: Diagnosis not present

## 2021-12-05 DIAGNOSIS — M79674 Pain in right toe(s): Secondary | ICD-10-CM | POA: Diagnosis not present

## 2021-12-05 DIAGNOSIS — M79675 Pain in left toe(s): Secondary | ICD-10-CM | POA: Diagnosis not present

## 2021-12-05 DIAGNOSIS — L600 Ingrowing nail: Secondary | ICD-10-CM | POA: Diagnosis not present

## 2021-12-05 DIAGNOSIS — L723 Sebaceous cyst: Secondary | ICD-10-CM | POA: Diagnosis not present

## 2021-12-06 DIAGNOSIS — M5417 Radiculopathy, lumbosacral region: Secondary | ICD-10-CM | POA: Diagnosis not present

## 2021-12-06 DIAGNOSIS — M48061 Spinal stenosis, lumbar region without neurogenic claudication: Secondary | ICD-10-CM | POA: Diagnosis not present

## 2021-12-06 DIAGNOSIS — I10 Essential (primary) hypertension: Secondary | ICD-10-CM | POA: Diagnosis not present

## 2021-12-06 DIAGNOSIS — Z6834 Body mass index (BMI) 34.0-34.9, adult: Secondary | ICD-10-CM | POA: Diagnosis not present

## 2021-12-06 DIAGNOSIS — Z299 Encounter for prophylactic measures, unspecified: Secondary | ICD-10-CM | POA: Diagnosis not present

## 2022-01-02 DIAGNOSIS — Z0000 Encounter for general adult medical examination without abnormal findings: Secondary | ICD-10-CM | POA: Diagnosis not present

## 2022-01-02 DIAGNOSIS — Z6835 Body mass index (BMI) 35.0-35.9, adult: Secondary | ICD-10-CM | POA: Diagnosis not present

## 2022-01-02 DIAGNOSIS — Z79899 Other long term (current) drug therapy: Secondary | ICD-10-CM | POA: Diagnosis not present

## 2022-01-02 DIAGNOSIS — I10 Essential (primary) hypertension: Secondary | ICD-10-CM | POA: Diagnosis not present

## 2022-01-02 DIAGNOSIS — E7800 Pure hypercholesterolemia, unspecified: Secondary | ICD-10-CM | POA: Diagnosis not present

## 2022-01-02 DIAGNOSIS — Z299 Encounter for prophylactic measures, unspecified: Secondary | ICD-10-CM | POA: Diagnosis not present

## 2022-01-02 DIAGNOSIS — Z1339 Encounter for screening examination for other mental health and behavioral disorders: Secondary | ICD-10-CM | POA: Diagnosis not present

## 2022-01-02 DIAGNOSIS — Z7189 Other specified counseling: Secondary | ICD-10-CM | POA: Diagnosis not present

## 2022-01-02 DIAGNOSIS — Z1331 Encounter for screening for depression: Secondary | ICD-10-CM | POA: Diagnosis not present

## 2022-01-02 DIAGNOSIS — R5383 Other fatigue: Secondary | ICD-10-CM | POA: Diagnosis not present

## 2022-01-02 DIAGNOSIS — Z789 Other specified health status: Secondary | ICD-10-CM | POA: Diagnosis not present

## 2022-02-25 DIAGNOSIS — M5416 Radiculopathy, lumbar region: Secondary | ICD-10-CM | POA: Diagnosis not present

## 2022-02-25 DIAGNOSIS — M5451 Vertebrogenic low back pain: Secondary | ICD-10-CM | POA: Diagnosis not present

## 2022-02-25 DIAGNOSIS — G894 Chronic pain syndrome: Secondary | ICD-10-CM | POA: Diagnosis not present

## 2022-02-25 DIAGNOSIS — M48061 Spinal stenosis, lumbar region without neurogenic claudication: Secondary | ICD-10-CM | POA: Diagnosis not present

## 2022-03-07 DIAGNOSIS — R0981 Nasal congestion: Secondary | ICD-10-CM | POA: Diagnosis not present

## 2022-03-07 DIAGNOSIS — J069 Acute upper respiratory infection, unspecified: Secondary | ICD-10-CM | POA: Diagnosis not present

## 2022-03-11 DIAGNOSIS — B351 Tinea unguium: Secondary | ICD-10-CM | POA: Diagnosis not present

## 2022-03-11 DIAGNOSIS — L608 Other nail disorders: Secondary | ICD-10-CM | POA: Diagnosis not present

## 2022-03-11 DIAGNOSIS — D2371 Other benign neoplasm of skin of right lower limb, including hip: Secondary | ICD-10-CM | POA: Diagnosis not present

## 2022-03-11 DIAGNOSIS — M79675 Pain in left toe(s): Secondary | ICD-10-CM | POA: Diagnosis not present

## 2022-03-11 DIAGNOSIS — M79674 Pain in right toe(s): Secondary | ICD-10-CM | POA: Diagnosis not present

## 2022-03-11 DIAGNOSIS — L723 Sebaceous cyst: Secondary | ICD-10-CM | POA: Diagnosis not present

## 2022-03-11 DIAGNOSIS — L602 Onychogryphosis: Secondary | ICD-10-CM | POA: Diagnosis not present

## 2022-03-11 DIAGNOSIS — M2041 Other hammer toe(s) (acquired), right foot: Secondary | ICD-10-CM | POA: Diagnosis not present

## 2022-03-25 DIAGNOSIS — M5451 Vertebrogenic low back pain: Secondary | ICD-10-CM | POA: Diagnosis not present

## 2022-03-25 DIAGNOSIS — M5416 Radiculopathy, lumbar region: Secondary | ICD-10-CM | POA: Diagnosis not present

## 2022-03-25 DIAGNOSIS — M48061 Spinal stenosis, lumbar region without neurogenic claudication: Secondary | ICD-10-CM | POA: Diagnosis not present

## 2022-03-25 DIAGNOSIS — G894 Chronic pain syndrome: Secondary | ICD-10-CM | POA: Diagnosis not present

## 2022-04-29 DIAGNOSIS — I10 Essential (primary) hypertension: Secondary | ICD-10-CM | POA: Diagnosis not present

## 2022-04-29 DIAGNOSIS — D692 Other nonthrombocytopenic purpura: Secondary | ICD-10-CM | POA: Diagnosis not present

## 2022-04-29 DIAGNOSIS — N1832 Chronic kidney disease, stage 3b: Secondary | ICD-10-CM | POA: Diagnosis not present

## 2022-04-29 DIAGNOSIS — Z299 Encounter for prophylactic measures, unspecified: Secondary | ICD-10-CM | POA: Diagnosis not present

## 2022-04-29 DIAGNOSIS — M5417 Radiculopathy, lumbosacral region: Secondary | ICD-10-CM | POA: Diagnosis not present

## 2022-05-07 DIAGNOSIS — S199XXA Unspecified injury of neck, initial encounter: Secondary | ICD-10-CM | POA: Diagnosis not present

## 2022-05-07 DIAGNOSIS — R942 Abnormal results of pulmonary function studies: Secondary | ICD-10-CM | POA: Diagnosis not present

## 2022-05-07 DIAGNOSIS — F17210 Nicotine dependence, cigarettes, uncomplicated: Secondary | ICD-10-CM | POA: Diagnosis not present

## 2022-05-07 DIAGNOSIS — S0093XA Contusion of unspecified part of head, initial encounter: Secondary | ICD-10-CM | POA: Diagnosis not present

## 2022-05-07 DIAGNOSIS — S0990XA Unspecified injury of head, initial encounter: Secondary | ICD-10-CM | POA: Diagnosis not present

## 2022-05-07 DIAGNOSIS — W19XXXA Unspecified fall, initial encounter: Secondary | ICD-10-CM | POA: Diagnosis not present

## 2022-05-07 DIAGNOSIS — S299XXA Unspecified injury of thorax, initial encounter: Secondary | ICD-10-CM | POA: Diagnosis not present

## 2022-05-07 DIAGNOSIS — E785 Hyperlipidemia, unspecified: Secondary | ICD-10-CM | POA: Diagnosis not present

## 2022-05-07 DIAGNOSIS — R0602 Shortness of breath: Secondary | ICD-10-CM | POA: Diagnosis not present

## 2022-05-07 DIAGNOSIS — I10 Essential (primary) hypertension: Secondary | ICD-10-CM | POA: Diagnosis not present

## 2022-06-11 DIAGNOSIS — Z299 Encounter for prophylactic measures, unspecified: Secondary | ICD-10-CM | POA: Diagnosis not present

## 2022-06-11 DIAGNOSIS — E041 Nontoxic single thyroid nodule: Secondary | ICD-10-CM | POA: Diagnosis not present

## 2022-06-11 DIAGNOSIS — M48061 Spinal stenosis, lumbar region without neurogenic claudication: Secondary | ICD-10-CM | POA: Diagnosis not present

## 2022-06-11 DIAGNOSIS — I10 Essential (primary) hypertension: Secondary | ICD-10-CM | POA: Diagnosis not present

## 2022-06-11 DIAGNOSIS — I77810 Thoracic aortic ectasia: Secondary | ICD-10-CM | POA: Diagnosis not present

## 2022-06-19 DIAGNOSIS — E041 Nontoxic single thyroid nodule: Secondary | ICD-10-CM | POA: Diagnosis not present

## 2022-07-02 IMAGING — CT CT CHEST W/O CM
2 of 5 series · 15 of 36 positions shown, 18 images · non-contrast
Comparison: 10/17/2020

CLINICAL DATA: Lung nodule, follow-up, prior AVR, chronic kidney
disease, CHF, hypertension, former smoker



[Series 4: chest 2.00 br40 s3 · coronal · 0.62mm/px · 3 of 168 slices shown]
[im 34/168  lung]
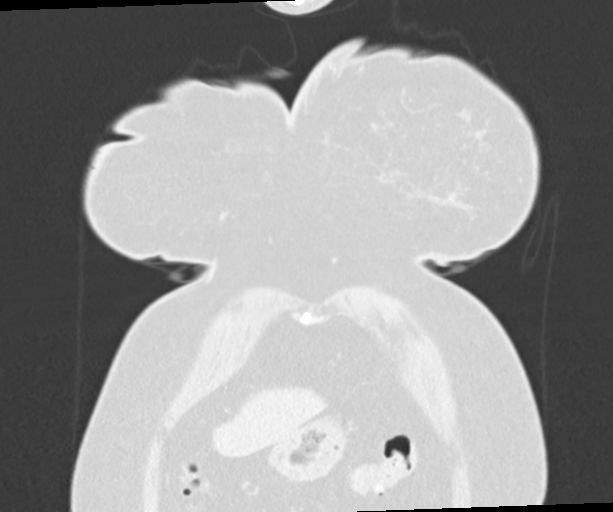
[im 67/168  lung]
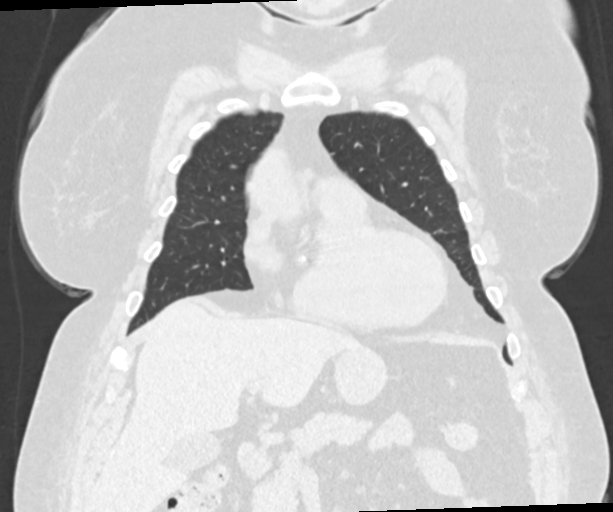
[im 101/168  lung]
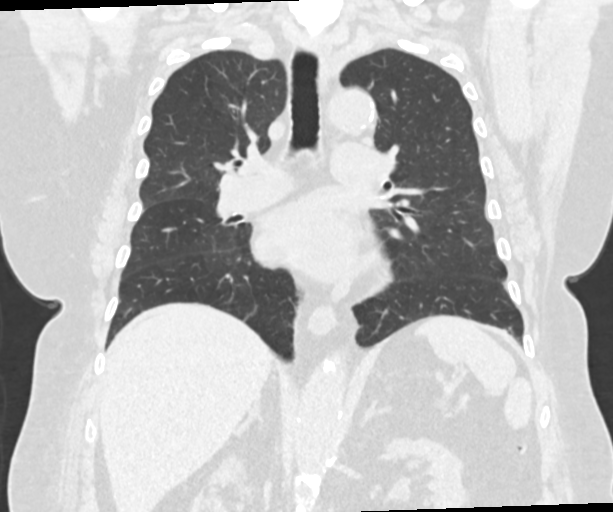

[Series 10: chest 1.00 br40 s3 super d · axial · 0.74mm/px · z∈[+1326,+1598]mm · 12 of 394 slices shown, 15 images]
[im 27/394  mediastinal]
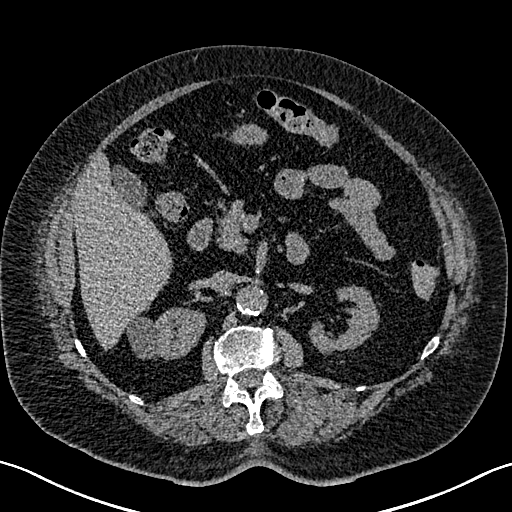
[im 27/394  lung]
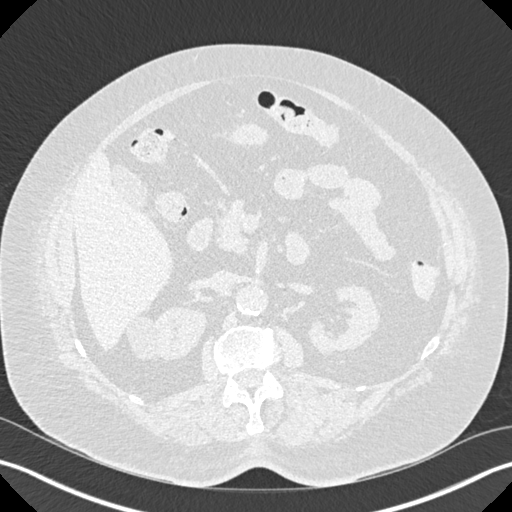
[im 53/394  lung]
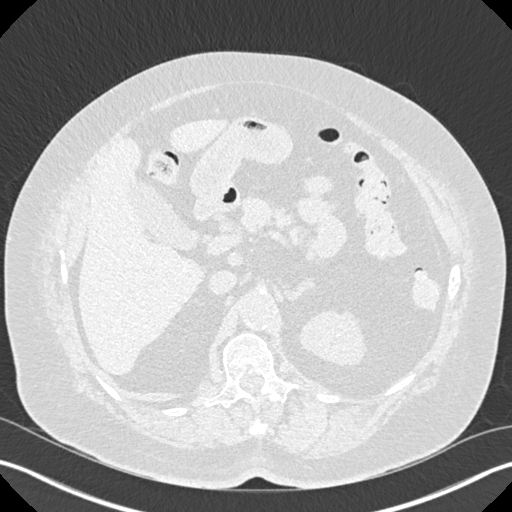
[im 79/394  lung]
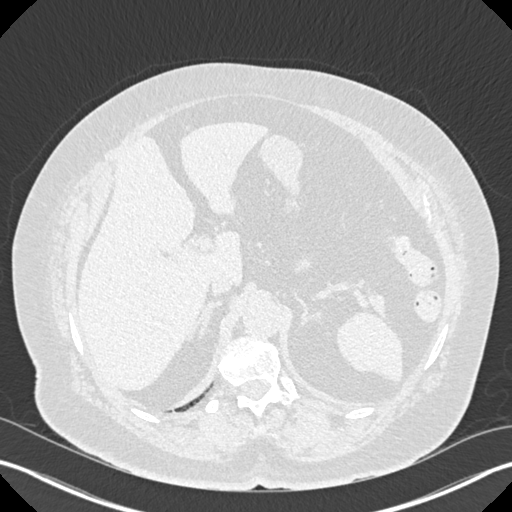
[im 132/394  lung]
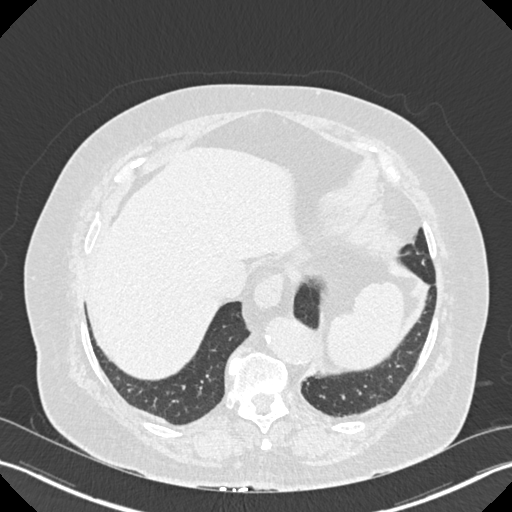
[im 158/394  mediastinal]
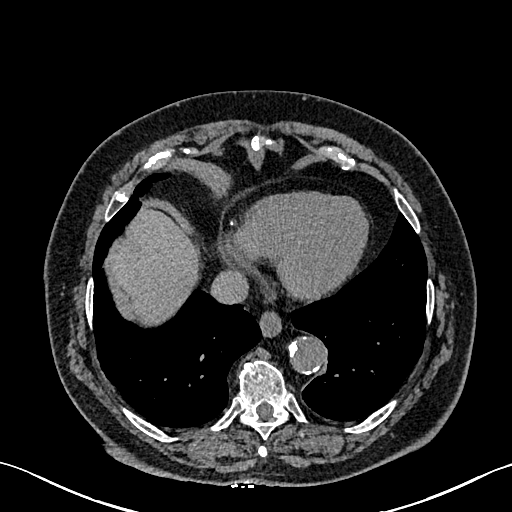
[im 158/394  lung]
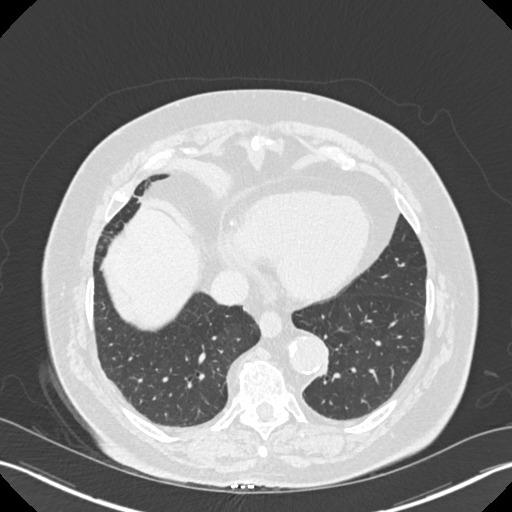
[im 184/394  lung]
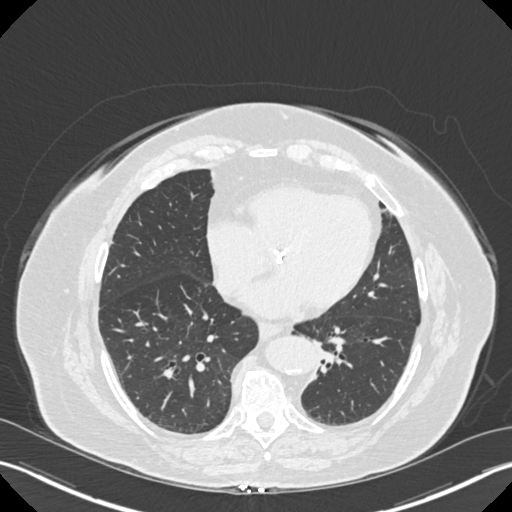
[im 210/394  lung]
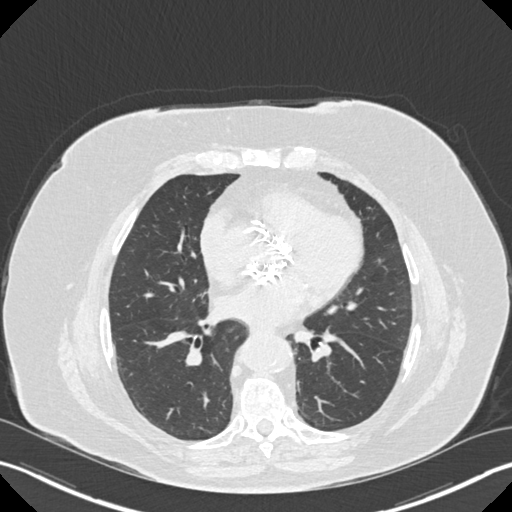
[im 236/394  lung]
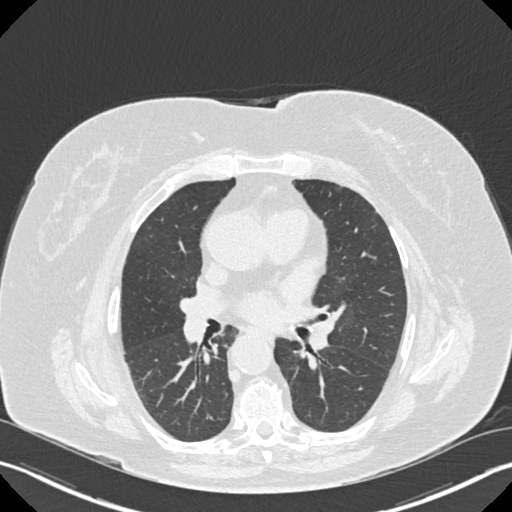
[im 263/394  mediastinal]
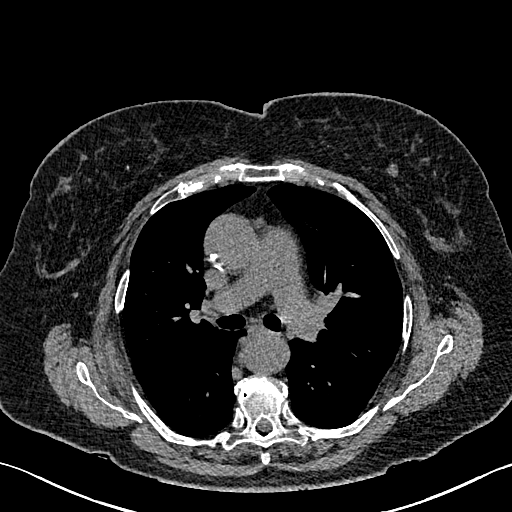
[im 263/394  lung]
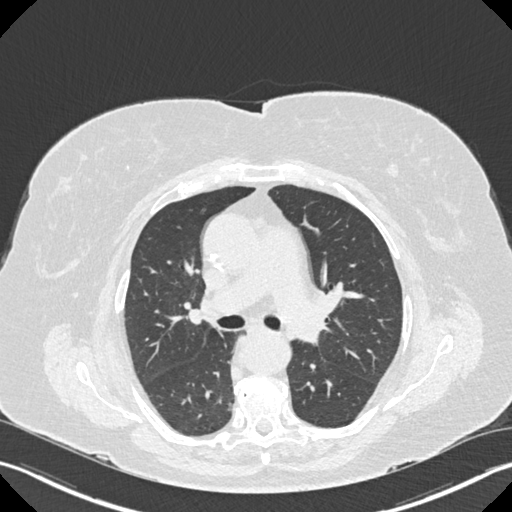
[im 315/394  lung]
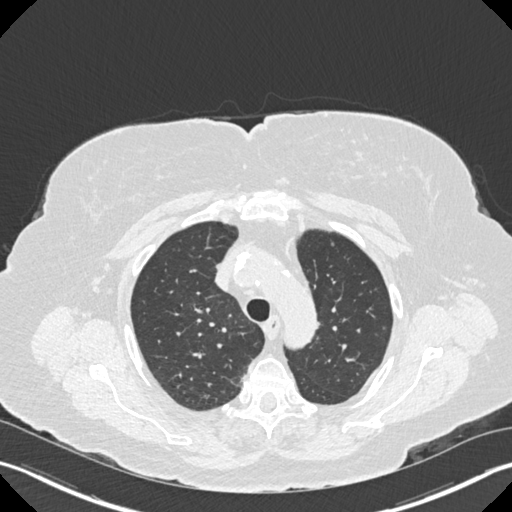
[im 341/394  lung]
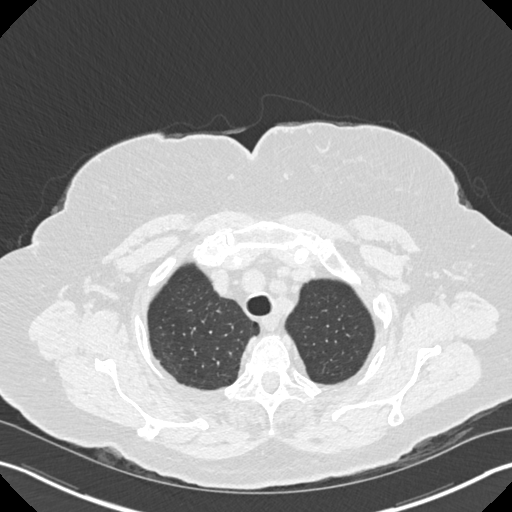
[im 367/394  lung]
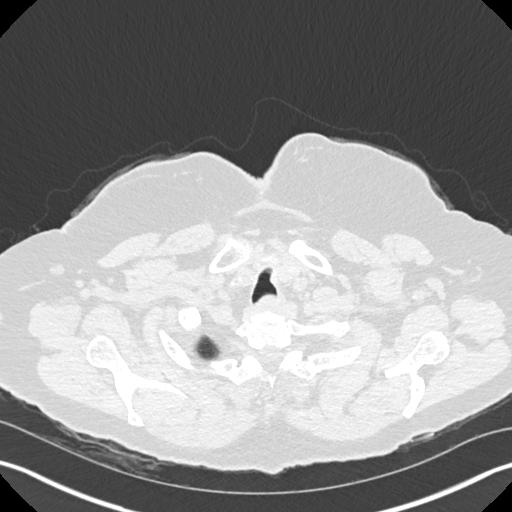

[15 of 36 positions shown; findings below may reference images not displayed]

FINDINGS: Cardiovascular: Post TAVR. Ascending thoracic aorta upper normal
caliber. Heart otherwise unremarkable. No pericardial effusion.

Mediastinum/Nodes: Esophagus unremarkable. Base of cervical region
normal appearance. 8 mm LEFT breast nodule image 53 slightly
increased in size. No thoracic adenopathy.

Lungs/Pleura: Calcified granuloma RIGHT upper lobe image 72
unchanged. Partially calcified nodule RIGHT lower lobe 15 x 11 mm
image 70 unchanged. Tiny nodule LEFT upper lobe image 29 unchanged.
Infiltrate at superior aspect of superior segment RIGHT lower lobe,
little changed. Nodular focus at this site seen on the previous exam
6-7 mm diameter no longer discretely visualized. No additional
pulmonary infiltrate, pleural effusion,

Upper Abdomen: Small cyst at the upper poles of both kidneys.
Colonic diverticulosis without evidence of diverticulitis. Remaining
visualized upper abdomen unremarkable.

Musculoskeletal: No acute osseous findings.
IMPRESSION: Old granulomatous disease.

Stable appearance of RIGHT lower lobe infiltrate.

No new intrathoracic abnormalities.

Colonic diverticulosis.

Nodular focus superior segment RIGHT lower lobe on the previous exam
measuring 6-7 mm diameter is no longer discretely visualized.

8 mm nodule LEFT breast, slightly increased; correlation with
mammography recommended, and potentially ultrasound as clinically
indicated.

Aortic Atherosclerosis ([id number].E).

## 2022-07-13 DIAGNOSIS — H9313 Tinnitus, bilateral: Secondary | ICD-10-CM | POA: Diagnosis not present

## 2022-07-13 DIAGNOSIS — H6123 Impacted cerumen, bilateral: Secondary | ICD-10-CM | POA: Diagnosis not present

## 2022-07-29 DIAGNOSIS — H6121 Impacted cerumen, right ear: Secondary | ICD-10-CM | POA: Diagnosis not present

## 2022-07-29 DIAGNOSIS — H9313 Tinnitus, bilateral: Secondary | ICD-10-CM | POA: Diagnosis not present

## 2022-07-29 DIAGNOSIS — H903 Sensorineural hearing loss, bilateral: Secondary | ICD-10-CM | POA: Diagnosis not present

## 2022-07-29 DIAGNOSIS — H9311 Tinnitus, right ear: Secondary | ICD-10-CM | POA: Diagnosis not present

## 2022-09-10 DIAGNOSIS — M5117 Intervertebral disc disorders with radiculopathy, lumbosacral region: Secondary | ICD-10-CM | POA: Diagnosis not present

## 2022-09-10 DIAGNOSIS — M48062 Spinal stenosis, lumbar region with neurogenic claudication: Secondary | ICD-10-CM | POA: Diagnosis not present

## 2022-10-07 DIAGNOSIS — I2510 Atherosclerotic heart disease of native coronary artery without angina pectoris: Secondary | ICD-10-CM | POA: Diagnosis not present

## 2022-10-07 DIAGNOSIS — M5116 Intervertebral disc disorders with radiculopathy, lumbar region: Secondary | ICD-10-CM | POA: Diagnosis not present

## 2022-10-07 DIAGNOSIS — Z87891 Personal history of nicotine dependence: Secondary | ICD-10-CM | POA: Diagnosis not present

## 2022-10-07 DIAGNOSIS — I10 Essential (primary) hypertension: Secondary | ICD-10-CM | POA: Diagnosis not present

## 2022-10-16 DIAGNOSIS — E041 Nontoxic single thyroid nodule: Secondary | ICD-10-CM | POA: Diagnosis not present

## 2022-10-16 DIAGNOSIS — M7989 Other specified soft tissue disorders: Secondary | ICD-10-CM | POA: Diagnosis not present

## 2022-10-16 DIAGNOSIS — Z299 Encounter for prophylactic measures, unspecified: Secondary | ICD-10-CM | POA: Diagnosis not present

## 2022-10-16 DIAGNOSIS — M48061 Spinal stenosis, lumbar region without neurogenic claudication: Secondary | ICD-10-CM | POA: Diagnosis not present

## 2022-10-16 DIAGNOSIS — I10 Essential (primary) hypertension: Secondary | ICD-10-CM | POA: Diagnosis not present

## 2022-10-21 DIAGNOSIS — B351 Tinea unguium: Secondary | ICD-10-CM | POA: Diagnosis not present

## 2022-10-21 DIAGNOSIS — D2371 Other benign neoplasm of skin of right lower limb, including hip: Secondary | ICD-10-CM | POA: Diagnosis not present

## 2022-10-21 DIAGNOSIS — L602 Onychogryphosis: Secondary | ICD-10-CM | POA: Diagnosis not present

## 2022-10-21 DIAGNOSIS — L600 Ingrowing nail: Secondary | ICD-10-CM | POA: Diagnosis not present

## 2022-10-21 DIAGNOSIS — L608 Other nail disorders: Secondary | ICD-10-CM | POA: Diagnosis not present

## 2022-10-21 DIAGNOSIS — M2041 Other hammer toe(s) (acquired), right foot: Secondary | ICD-10-CM | POA: Diagnosis not present

## 2022-10-21 DIAGNOSIS — L603 Nail dystrophy: Secondary | ICD-10-CM | POA: Diagnosis not present

## 2022-10-21 DIAGNOSIS — L723 Sebaceous cyst: Secondary | ICD-10-CM | POA: Diagnosis not present

## 2022-11-06 DIAGNOSIS — Z1231 Encounter for screening mammogram for malignant neoplasm of breast: Secondary | ICD-10-CM | POA: Diagnosis not present

## 2022-11-07 DIAGNOSIS — M542 Cervicalgia: Secondary | ICD-10-CM | POA: Diagnosis not present

## 2022-12-03 DIAGNOSIS — M5117 Intervertebral disc disorders with radiculopathy, lumbosacral region: Secondary | ICD-10-CM | POA: Diagnosis not present

## 2022-12-03 DIAGNOSIS — M48062 Spinal stenosis, lumbar region with neurogenic claudication: Secondary | ICD-10-CM | POA: Diagnosis not present

## 2022-12-29 DIAGNOSIS — Z7982 Long term (current) use of aspirin: Secondary | ICD-10-CM | POA: Diagnosis not present

## 2022-12-29 DIAGNOSIS — N939 Abnormal uterine and vaginal bleeding, unspecified: Secondary | ICD-10-CM | POA: Diagnosis not present

## 2022-12-29 DIAGNOSIS — N200 Calculus of kidney: Secondary | ICD-10-CM | POA: Diagnosis not present

## 2023-01-01 DIAGNOSIS — L900 Lichen sclerosus et atrophicus: Secondary | ICD-10-CM | POA: Diagnosis not present

## 2023-01-01 DIAGNOSIS — Z124 Encounter for screening for malignant neoplasm of cervix: Secondary | ICD-10-CM | POA: Diagnosis not present

## 2023-01-01 DIAGNOSIS — N950 Postmenopausal bleeding: Secondary | ICD-10-CM | POA: Diagnosis not present

## 2023-01-05 DIAGNOSIS — R9389 Abnormal findings on diagnostic imaging of other specified body structures: Secondary | ICD-10-CM | POA: Diagnosis not present

## 2023-01-05 DIAGNOSIS — N950 Postmenopausal bleeding: Secondary | ICD-10-CM | POA: Diagnosis not present

## 2023-01-06 DIAGNOSIS — D2371 Other benign neoplasm of skin of right lower limb, including hip: Secondary | ICD-10-CM | POA: Diagnosis not present

## 2023-01-06 DIAGNOSIS — M79674 Pain in right toe(s): Secondary | ICD-10-CM | POA: Diagnosis not present

## 2023-01-06 DIAGNOSIS — L602 Onychogryphosis: Secondary | ICD-10-CM | POA: Diagnosis not present

## 2023-01-06 DIAGNOSIS — M79671 Pain in right foot: Secondary | ICD-10-CM | POA: Diagnosis not present

## 2023-01-06 DIAGNOSIS — M2041 Other hammer toe(s) (acquired), right foot: Secondary | ICD-10-CM | POA: Diagnosis not present

## 2023-01-06 DIAGNOSIS — L723 Sebaceous cyst: Secondary | ICD-10-CM | POA: Diagnosis not present

## 2023-01-06 DIAGNOSIS — B351 Tinea unguium: Secondary | ICD-10-CM | POA: Diagnosis not present

## 2023-01-06 DIAGNOSIS — L603 Nail dystrophy: Secondary | ICD-10-CM | POA: Diagnosis not present

## 2023-01-14 DIAGNOSIS — E7800 Pure hypercholesterolemia, unspecified: Secondary | ICD-10-CM | POA: Diagnosis not present

## 2023-01-14 DIAGNOSIS — R5383 Other fatigue: Secondary | ICD-10-CM | POA: Diagnosis not present

## 2023-01-14 DIAGNOSIS — Z1331 Encounter for screening for depression: Secondary | ICD-10-CM | POA: Diagnosis not present

## 2023-01-14 DIAGNOSIS — Z79899 Other long term (current) drug therapy: Secondary | ICD-10-CM | POA: Diagnosis not present

## 2023-01-14 DIAGNOSIS — Z299 Encounter for prophylactic measures, unspecified: Secondary | ICD-10-CM | POA: Diagnosis not present

## 2023-01-14 DIAGNOSIS — Z0000 Encounter for general adult medical examination without abnormal findings: Secondary | ICD-10-CM | POA: Diagnosis not present

## 2023-01-14 DIAGNOSIS — Z7189 Other specified counseling: Secondary | ICD-10-CM | POA: Diagnosis not present

## 2023-01-14 DIAGNOSIS — Z6833 Body mass index (BMI) 33.0-33.9, adult: Secondary | ICD-10-CM | POA: Diagnosis not present

## 2023-01-14 DIAGNOSIS — I10 Essential (primary) hypertension: Secondary | ICD-10-CM | POA: Diagnosis not present

## 2023-01-14 DIAGNOSIS — Z1339 Encounter for screening examination for other mental health and behavioral disorders: Secondary | ICD-10-CM | POA: Diagnosis not present

## 2023-01-27 DIAGNOSIS — N858 Other specified noninflammatory disorders of uterus: Secondary | ICD-10-CM | POA: Diagnosis not present

## 2023-04-10 DIAGNOSIS — R935 Abnormal findings on diagnostic imaging of other abdominal regions, including retroperitoneum: Secondary | ICD-10-CM | POA: Diagnosis not present

## 2023-04-10 DIAGNOSIS — C541 Malignant neoplasm of endometrium: Secondary | ICD-10-CM | POA: Diagnosis not present

## 2023-04-10 DIAGNOSIS — N950 Postmenopausal bleeding: Secondary | ICD-10-CM | POA: Diagnosis not present

## 2023-04-16 DIAGNOSIS — M79675 Pain in left toe(s): Secondary | ICD-10-CM | POA: Diagnosis not present

## 2023-04-16 DIAGNOSIS — L600 Ingrowing nail: Secondary | ICD-10-CM | POA: Diagnosis not present

## 2023-04-16 DIAGNOSIS — L603 Nail dystrophy: Secondary | ICD-10-CM | POA: Diagnosis not present

## 2023-04-16 DIAGNOSIS — L608 Other nail disorders: Secondary | ICD-10-CM | POA: Diagnosis not present

## 2023-04-16 DIAGNOSIS — D2371 Other benign neoplasm of skin of right lower limb, including hip: Secondary | ICD-10-CM | POA: Diagnosis not present

## 2023-04-16 DIAGNOSIS — M79674 Pain in right toe(s): Secondary | ICD-10-CM | POA: Diagnosis not present

## 2023-04-16 DIAGNOSIS — L602 Onychogryphosis: Secondary | ICD-10-CM | POA: Diagnosis not present

## 2023-04-16 DIAGNOSIS — B351 Tinea unguium: Secondary | ICD-10-CM | POA: Diagnosis not present

## 2023-04-16 DIAGNOSIS — M2041 Other hammer toe(s) (acquired), right foot: Secondary | ICD-10-CM | POA: Diagnosis not present

## 2023-04-22 DIAGNOSIS — I272 Pulmonary hypertension, unspecified: Secondary | ICD-10-CM | POA: Diagnosis not present

## 2023-04-22 DIAGNOSIS — I700 Atherosclerosis of aorta: Secondary | ICD-10-CM | POA: Diagnosis not present

## 2023-04-22 DIAGNOSIS — N1832 Chronic kidney disease, stage 3b: Secondary | ICD-10-CM | POA: Diagnosis not present

## 2023-04-22 DIAGNOSIS — Z299 Encounter for prophylactic measures, unspecified: Secondary | ICD-10-CM | POA: Diagnosis not present

## 2023-04-22 DIAGNOSIS — I10 Essential (primary) hypertension: Secondary | ICD-10-CM | POA: Diagnosis not present

## 2023-04-22 DIAGNOSIS — I5032 Chronic diastolic (congestive) heart failure: Secondary | ICD-10-CM | POA: Diagnosis not present

## 2023-04-24 DIAGNOSIS — C541 Malignant neoplasm of endometrium: Secondary | ICD-10-CM | POA: Diagnosis not present

## 2023-04-24 DIAGNOSIS — Z01818 Encounter for other preprocedural examination: Secondary | ICD-10-CM | POA: Diagnosis not present

## 2023-04-30 DIAGNOSIS — M5126 Other intervertebral disc displacement, lumbar region: Secondary | ICD-10-CM | POA: Diagnosis not present

## 2023-04-30 DIAGNOSIS — M5127 Other intervertebral disc displacement, lumbosacral region: Secondary | ICD-10-CM | POA: Diagnosis not present

## 2023-04-30 DIAGNOSIS — M48061 Spinal stenosis, lumbar region without neurogenic claudication: Secondary | ICD-10-CM | POA: Diagnosis not present

## 2023-04-30 DIAGNOSIS — M9963 Osseous and subluxation stenosis of intervertebral foramina of lumbar region: Secondary | ICD-10-CM | POA: Diagnosis not present

## 2023-05-05 DIAGNOSIS — M5127 Other intervertebral disc displacement, lumbosacral region: Secondary | ICD-10-CM | POA: Diagnosis not present

## 2023-05-05 DIAGNOSIS — I10 Essential (primary) hypertension: Secondary | ICD-10-CM | POA: Diagnosis not present

## 2023-05-05 DIAGNOSIS — I5032 Chronic diastolic (congestive) heart failure: Secondary | ICD-10-CM | POA: Diagnosis not present

## 2023-05-05 DIAGNOSIS — Z299 Encounter for prophylactic measures, unspecified: Secondary | ICD-10-CM | POA: Diagnosis not present

## 2023-05-05 DIAGNOSIS — M5416 Radiculopathy, lumbar region: Secondary | ICD-10-CM | POA: Diagnosis not present

## 2023-05-12 DIAGNOSIS — C541 Malignant neoplasm of endometrium: Secondary | ICD-10-CM | POA: Diagnosis not present

## 2023-05-12 DIAGNOSIS — Z01810 Encounter for preprocedural cardiovascular examination: Secondary | ICD-10-CM | POA: Diagnosis not present

## 2023-05-12 DIAGNOSIS — R001 Bradycardia, unspecified: Secondary | ICD-10-CM | POA: Diagnosis not present

## 2023-05-12 DIAGNOSIS — Z01812 Encounter for preprocedural laboratory examination: Secondary | ICD-10-CM | POA: Diagnosis not present

## 2023-05-12 DIAGNOSIS — I493 Ventricular premature depolarization: Secondary | ICD-10-CM | POA: Diagnosis not present

## 2023-05-13 DIAGNOSIS — M5442 Lumbago with sciatica, left side: Secondary | ICD-10-CM | POA: Diagnosis not present

## 2023-05-13 DIAGNOSIS — Z952 Presence of prosthetic heart valve: Secondary | ICD-10-CM | POA: Diagnosis not present

## 2023-05-13 DIAGNOSIS — I10 Essential (primary) hypertension: Secondary | ICD-10-CM | POA: Diagnosis not present

## 2023-05-13 DIAGNOSIS — N289 Disorder of kidney and ureter, unspecified: Secondary | ICD-10-CM | POA: Diagnosis not present

## 2023-05-13 DIAGNOSIS — C55 Malignant neoplasm of uterus, part unspecified: Secondary | ICD-10-CM | POA: Diagnosis not present

## 2023-05-13 DIAGNOSIS — E785 Hyperlipidemia, unspecified: Secondary | ICD-10-CM | POA: Diagnosis not present

## 2023-05-13 DIAGNOSIS — M5441 Lumbago with sciatica, right side: Secondary | ICD-10-CM | POA: Diagnosis not present

## 2023-05-13 DIAGNOSIS — R600 Localized edema: Secondary | ICD-10-CM | POA: Diagnosis not present

## 2023-05-13 DIAGNOSIS — G8929 Other chronic pain: Secondary | ICD-10-CM | POA: Diagnosis not present

## 2023-05-14 DIAGNOSIS — I10 Essential (primary) hypertension: Secondary | ICD-10-CM | POA: Diagnosis not present

## 2023-05-14 DIAGNOSIS — N184 Chronic kidney disease, stage 4 (severe): Secondary | ICD-10-CM | POA: Diagnosis not present

## 2023-05-14 DIAGNOSIS — N179 Acute kidney failure, unspecified: Secondary | ICD-10-CM | POA: Diagnosis not present

## 2023-05-14 DIAGNOSIS — I129 Hypertensive chronic kidney disease with stage 1 through stage 4 chronic kidney disease, or unspecified chronic kidney disease: Secondary | ICD-10-CM | POA: Diagnosis not present

## 2023-05-15 DIAGNOSIS — R350 Frequency of micturition: Secondary | ICD-10-CM | POA: Diagnosis not present

## 2023-05-15 DIAGNOSIS — N184 Chronic kidney disease, stage 4 (severe): Secondary | ICD-10-CM | POA: Diagnosis not present

## 2023-06-17 DIAGNOSIS — Z713 Dietary counseling and surveillance: Secondary | ICD-10-CM | POA: Diagnosis not present

## 2023-06-17 DIAGNOSIS — G894 Chronic pain syndrome: Secondary | ICD-10-CM | POA: Diagnosis not present

## 2023-06-17 DIAGNOSIS — Z79899 Other long term (current) drug therapy: Secondary | ICD-10-CM | POA: Diagnosis not present

## 2023-06-17 DIAGNOSIS — M5416 Radiculopathy, lumbar region: Secondary | ICD-10-CM | POA: Diagnosis not present

## 2023-06-17 DIAGNOSIS — R030 Elevated blood-pressure reading, without diagnosis of hypertension: Secondary | ICD-10-CM | POA: Diagnosis not present

## 2023-06-27 DIAGNOSIS — Z713 Dietary counseling and surveillance: Secondary | ICD-10-CM | POA: Diagnosis not present

## 2023-06-27 DIAGNOSIS — Z79899 Other long term (current) drug therapy: Secondary | ICD-10-CM | POA: Diagnosis not present

## 2023-06-27 DIAGNOSIS — R030 Elevated blood-pressure reading, without diagnosis of hypertension: Secondary | ICD-10-CM | POA: Diagnosis not present

## 2023-06-27 DIAGNOSIS — G894 Chronic pain syndrome: Secondary | ICD-10-CM | POA: Diagnosis not present

## 2023-06-27 DIAGNOSIS — M5416 Radiculopathy, lumbar region: Secondary | ICD-10-CM | POA: Diagnosis not present

## 2023-07-27 DIAGNOSIS — R030 Elevated blood-pressure reading, without diagnosis of hypertension: Secondary | ICD-10-CM | POA: Diagnosis not present

## 2023-07-27 DIAGNOSIS — Z79899 Other long term (current) drug therapy: Secondary | ICD-10-CM | POA: Diagnosis not present

## 2023-07-27 DIAGNOSIS — Z713 Dietary counseling and surveillance: Secondary | ICD-10-CM | POA: Diagnosis not present

## 2023-07-27 DIAGNOSIS — M5416 Radiculopathy, lumbar region: Secondary | ICD-10-CM | POA: Diagnosis not present

## 2023-07-27 DIAGNOSIS — C541 Malignant neoplasm of endometrium: Secondary | ICD-10-CM | POA: Diagnosis not present

## 2023-07-27 DIAGNOSIS — N184 Chronic kidney disease, stage 4 (severe): Secondary | ICD-10-CM | POA: Diagnosis not present

## 2023-07-27 DIAGNOSIS — G894 Chronic pain syndrome: Secondary | ICD-10-CM | POA: Diagnosis not present

## 2023-08-03 DIAGNOSIS — M85851 Other specified disorders of bone density and structure, right thigh: Secondary | ICD-10-CM | POA: Diagnosis not present

## 2023-08-03 DIAGNOSIS — T148XXA Other injury of unspecified body region, initial encounter: Secondary | ICD-10-CM | POA: Diagnosis not present

## 2023-08-03 DIAGNOSIS — R55 Syncope and collapse: Secondary | ICD-10-CM | POA: Diagnosis not present

## 2023-08-03 DIAGNOSIS — N184 Chronic kidney disease, stage 4 (severe): Secondary | ICD-10-CM | POA: Diagnosis not present

## 2023-08-03 DIAGNOSIS — W19XXXA Unspecified fall, initial encounter: Secondary | ICD-10-CM | POA: Diagnosis not present

## 2023-08-03 DIAGNOSIS — M545 Low back pain, unspecified: Secondary | ICD-10-CM | POA: Diagnosis not present

## 2023-08-03 DIAGNOSIS — M160 Bilateral primary osteoarthritis of hip: Secondary | ICD-10-CM | POA: Diagnosis not present

## 2023-08-03 DIAGNOSIS — G8929 Other chronic pain: Secondary | ICD-10-CM | POA: Diagnosis not present

## 2023-08-03 DIAGNOSIS — I2510 Atherosclerotic heart disease of native coronary artery without angina pectoris: Secondary | ICD-10-CM | POA: Diagnosis not present

## 2023-08-03 DIAGNOSIS — I129 Hypertensive chronic kidney disease with stage 1 through stage 4 chronic kidney disease, or unspecified chronic kidney disease: Secondary | ICD-10-CM | POA: Diagnosis not present

## 2023-08-04 DIAGNOSIS — R200 Anesthesia of skin: Secondary | ICD-10-CM | POA: Diagnosis not present

## 2023-08-05 DIAGNOSIS — R918 Other nonspecific abnormal finding of lung field: Secondary | ICD-10-CM | POA: Diagnosis not present

## 2023-08-05 DIAGNOSIS — C541 Malignant neoplasm of endometrium: Secondary | ICD-10-CM | POA: Diagnosis not present

## 2023-08-11 DIAGNOSIS — C541 Malignant neoplasm of endometrium: Secondary | ICD-10-CM | POA: Diagnosis not present

## 2023-08-12 DIAGNOSIS — C55 Malignant neoplasm of uterus, part unspecified: Secondary | ICD-10-CM | POA: Diagnosis not present

## 2023-08-12 DIAGNOSIS — N289 Disorder of kidney and ureter, unspecified: Secondary | ICD-10-CM | POA: Diagnosis not present

## 2023-08-12 DIAGNOSIS — M5441 Lumbago with sciatica, right side: Secondary | ICD-10-CM | POA: Diagnosis not present

## 2023-08-12 DIAGNOSIS — Z952 Presence of prosthetic heart valve: Secondary | ICD-10-CM | POA: Diagnosis not present

## 2023-08-12 DIAGNOSIS — Z87891 Personal history of nicotine dependence: Secondary | ICD-10-CM | POA: Diagnosis not present

## 2023-08-12 DIAGNOSIS — I10 Essential (primary) hypertension: Secondary | ICD-10-CM | POA: Diagnosis not present

## 2023-08-12 DIAGNOSIS — M5442 Lumbago with sciatica, left side: Secondary | ICD-10-CM | POA: Diagnosis not present

## 2023-08-13 DIAGNOSIS — M47817 Spondylosis without myelopathy or radiculopathy, lumbosacral region: Secondary | ICD-10-CM | POA: Diagnosis not present

## 2023-08-13 DIAGNOSIS — Z01810 Encounter for preprocedural cardiovascular examination: Secondary | ICD-10-CM | POA: Diagnosis not present

## 2023-08-13 DIAGNOSIS — E782 Mixed hyperlipidemia: Secondary | ICD-10-CM | POA: Diagnosis not present

## 2023-08-13 DIAGNOSIS — I10 Essential (primary) hypertension: Secondary | ICD-10-CM | POA: Diagnosis not present

## 2023-08-13 DIAGNOSIS — E66812 Obesity, class 2: Secondary | ICD-10-CM | POA: Diagnosis not present

## 2023-08-13 DIAGNOSIS — C541 Malignant neoplasm of endometrium: Secondary | ICD-10-CM | POA: Diagnosis not present

## 2023-08-13 DIAGNOSIS — N184 Chronic kidney disease, stage 4 (severe): Secondary | ICD-10-CM | POA: Diagnosis not present

## 2023-08-13 DIAGNOSIS — Z6837 Body mass index (BMI) 37.0-37.9, adult: Secondary | ICD-10-CM | POA: Diagnosis not present

## 2023-08-13 DIAGNOSIS — Z952 Presence of prosthetic heart valve: Secondary | ICD-10-CM | POA: Diagnosis not present

## 2023-08-19 DIAGNOSIS — D649 Anemia, unspecified: Secondary | ICD-10-CM | POA: Diagnosis not present

## 2023-08-19 DIAGNOSIS — E875 Hyperkalemia: Secondary | ICD-10-CM | POA: Diagnosis not present

## 2023-08-20 DIAGNOSIS — N184 Chronic kidney disease, stage 4 (severe): Secondary | ICD-10-CM | POA: Diagnosis not present

## 2023-08-20 DIAGNOSIS — E875 Hyperkalemia: Secondary | ICD-10-CM | POA: Diagnosis not present

## 2023-08-20 DIAGNOSIS — D649 Anemia, unspecified: Secondary | ICD-10-CM | POA: Diagnosis not present

## 2023-08-20 DIAGNOSIS — Z01818 Encounter for other preprocedural examination: Secondary | ICD-10-CM | POA: Diagnosis not present

## 2023-08-20 DIAGNOSIS — N736 Female pelvic peritoneal adhesions (postinfective): Secondary | ICD-10-CM | POA: Diagnosis not present

## 2023-08-20 DIAGNOSIS — Z79899 Other long term (current) drug therapy: Secondary | ICD-10-CM | POA: Diagnosis not present

## 2023-08-20 DIAGNOSIS — I129 Hypertensive chronic kidney disease with stage 1 through stage 4 chronic kidney disease, or unspecified chronic kidney disease: Secondary | ICD-10-CM | POA: Diagnosis not present

## 2023-08-20 DIAGNOSIS — C541 Malignant neoplasm of endometrium: Secondary | ICD-10-CM | POA: Diagnosis not present

## 2023-08-25 DIAGNOSIS — I2510 Atherosclerotic heart disease of native coronary artery without angina pectoris: Secondary | ICD-10-CM | POA: Diagnosis not present

## 2023-08-25 DIAGNOSIS — Z01810 Encounter for preprocedural cardiovascular examination: Secondary | ICD-10-CM | POA: Diagnosis not present

## 2023-08-25 DIAGNOSIS — N189 Chronic kidney disease, unspecified: Secondary | ICD-10-CM | POA: Diagnosis not present

## 2023-08-27 DIAGNOSIS — N888 Other specified noninflammatory disorders of cervix uteri: Secondary | ICD-10-CM | POA: Diagnosis not present

## 2023-08-27 DIAGNOSIS — N83202 Unspecified ovarian cyst, left side: Secondary | ICD-10-CM | POA: Diagnosis not present

## 2023-08-27 DIAGNOSIS — N8003 Adenomyosis of the uterus: Secondary | ICD-10-CM | POA: Diagnosis not present

## 2023-08-27 DIAGNOSIS — N838 Other noninflammatory disorders of ovary, fallopian tube and broad ligament: Secondary | ICD-10-CM | POA: Diagnosis not present

## 2023-08-27 DIAGNOSIS — C7982 Secondary malignant neoplasm of genital organs: Secondary | ICD-10-CM | POA: Diagnosis not present

## 2023-08-27 DIAGNOSIS — N83201 Unspecified ovarian cyst, right side: Secondary | ICD-10-CM | POA: Diagnosis not present

## 2023-08-27 DIAGNOSIS — N184 Chronic kidney disease, stage 4 (severe): Secondary | ICD-10-CM | POA: Diagnosis not present

## 2023-08-27 DIAGNOSIS — E875 Hyperkalemia: Secondary | ICD-10-CM | POA: Diagnosis not present

## 2023-08-27 DIAGNOSIS — D649 Anemia, unspecified: Secondary | ICD-10-CM | POA: Diagnosis not present

## 2023-08-27 DIAGNOSIS — N9489 Other specified conditions associated with female genital organs and menstrual cycle: Secondary | ICD-10-CM | POA: Diagnosis not present

## 2023-08-27 DIAGNOSIS — I129 Hypertensive chronic kidney disease with stage 1 through stage 4 chronic kidney disease, or unspecified chronic kidney disease: Secondary | ICD-10-CM | POA: Diagnosis not present

## 2023-08-27 DIAGNOSIS — N736 Female pelvic peritoneal adhesions (postinfective): Secondary | ICD-10-CM | POA: Diagnosis not present

## 2023-08-27 DIAGNOSIS — C541 Malignant neoplasm of endometrium: Secondary | ICD-10-CM | POA: Diagnosis not present

## 2023-08-27 DIAGNOSIS — N72 Inflammatory disease of cervix uteri: Secondary | ICD-10-CM | POA: Diagnosis not present

## 2023-08-27 DIAGNOSIS — D360 Benign neoplasm of lymph nodes: Secondary | ICD-10-CM | POA: Diagnosis not present

## 2023-08-27 DIAGNOSIS — Z79899 Other long term (current) drug therapy: Secondary | ICD-10-CM | POA: Diagnosis not present

## 2023-08-28 DIAGNOSIS — D649 Anemia, unspecified: Secondary | ICD-10-CM | POA: Diagnosis not present

## 2023-08-28 DIAGNOSIS — I129 Hypertensive chronic kidney disease with stage 1 through stage 4 chronic kidney disease, or unspecified chronic kidney disease: Secondary | ICD-10-CM | POA: Diagnosis not present

## 2023-08-28 DIAGNOSIS — N179 Acute kidney failure, unspecified: Secondary | ICD-10-CM | POA: Diagnosis not present

## 2023-08-28 DIAGNOSIS — I10 Essential (primary) hypertension: Secondary | ICD-10-CM | POA: Diagnosis not present

## 2023-08-28 DIAGNOSIS — E875 Hyperkalemia: Secondary | ICD-10-CM | POA: Diagnosis not present

## 2023-08-28 DIAGNOSIS — C541 Malignant neoplasm of endometrium: Secondary | ICD-10-CM | POA: Diagnosis not present

## 2023-08-28 DIAGNOSIS — N736 Female pelvic peritoneal adhesions (postinfective): Secondary | ICD-10-CM | POA: Diagnosis not present

## 2023-08-28 DIAGNOSIS — N184 Chronic kidney disease, stage 4 (severe): Secondary | ICD-10-CM | POA: Diagnosis not present

## 2023-08-31 DIAGNOSIS — R9431 Abnormal electrocardiogram [ECG] [EKG]: Secondary | ICD-10-CM | POA: Diagnosis not present

## 2023-08-31 DIAGNOSIS — I491 Atrial premature depolarization: Secondary | ICD-10-CM | POA: Diagnosis not present

## 2023-08-31 DIAGNOSIS — R1013 Epigastric pain: Secondary | ICD-10-CM | POA: Diagnosis not present

## 2023-08-31 DIAGNOSIS — R110 Nausea: Secondary | ICD-10-CM | POA: Diagnosis not present

## 2023-08-31 DIAGNOSIS — I10 Essential (primary) hypertension: Secondary | ICD-10-CM | POA: Diagnosis not present

## 2023-08-31 DIAGNOSIS — R197 Diarrhea, unspecified: Secondary | ICD-10-CM | POA: Diagnosis not present

## 2023-08-31 DIAGNOSIS — I4891 Unspecified atrial fibrillation: Secondary | ICD-10-CM | POA: Diagnosis not present

## 2023-08-31 DIAGNOSIS — I493 Ventricular premature depolarization: Secondary | ICD-10-CM | POA: Diagnosis not present

## 2023-08-31 DIAGNOSIS — E872 Acidosis, unspecified: Secondary | ICD-10-CM | POA: Diagnosis not present

## 2023-08-31 DIAGNOSIS — I482 Chronic atrial fibrillation, unspecified: Secondary | ICD-10-CM | POA: Diagnosis not present

## 2023-08-31 DIAGNOSIS — Z90710 Acquired absence of both cervix and uterus: Secondary | ICD-10-CM | POA: Diagnosis not present

## 2023-08-31 DIAGNOSIS — N184 Chronic kidney disease, stage 4 (severe): Secondary | ICD-10-CM | POA: Diagnosis not present

## 2023-08-31 DIAGNOSIS — Z87891 Personal history of nicotine dependence: Secondary | ICD-10-CM | POA: Diagnosis not present

## 2023-08-31 DIAGNOSIS — E860 Dehydration: Secondary | ICD-10-CM | POA: Diagnosis not present

## 2023-08-31 DIAGNOSIS — I517 Cardiomegaly: Secondary | ICD-10-CM | POA: Diagnosis not present

## 2023-08-31 DIAGNOSIS — R112 Nausea with vomiting, unspecified: Secondary | ICD-10-CM | POA: Diagnosis not present

## 2023-09-01 DIAGNOSIS — I4891 Unspecified atrial fibrillation: Secondary | ICD-10-CM | POA: Diagnosis not present

## 2023-09-01 DIAGNOSIS — Z743 Need for continuous supervision: Secondary | ICD-10-CM | POA: Diagnosis not present

## 2023-09-01 DIAGNOSIS — R197 Diarrhea, unspecified: Secondary | ICD-10-CM | POA: Diagnosis not present

## 2023-09-01 DIAGNOSIS — E6609 Other obesity due to excess calories: Secondary | ICD-10-CM | POA: Diagnosis not present

## 2023-09-01 DIAGNOSIS — D72829 Elevated white blood cell count, unspecified: Secondary | ICD-10-CM | POA: Diagnosis not present

## 2023-09-01 DIAGNOSIS — E872 Acidosis, unspecified: Secondary | ICD-10-CM | POA: Diagnosis not present

## 2023-09-01 DIAGNOSIS — M069 Rheumatoid arthritis, unspecified: Secondary | ICD-10-CM | POA: Diagnosis not present

## 2023-09-01 DIAGNOSIS — R531 Weakness: Secondary | ICD-10-CM | POA: Diagnosis not present

## 2023-09-01 DIAGNOSIS — E782 Mixed hyperlipidemia: Secondary | ICD-10-CM | POA: Diagnosis not present

## 2023-09-01 DIAGNOSIS — C541 Malignant neoplasm of endometrium: Secondary | ICD-10-CM | POA: Diagnosis not present

## 2023-09-01 DIAGNOSIS — Z87891 Personal history of nicotine dependence: Secondary | ICD-10-CM | POA: Diagnosis not present

## 2023-09-01 DIAGNOSIS — R9431 Abnormal electrocardiogram [ECG] [EKG]: Secondary | ICD-10-CM | POA: Diagnosis not present

## 2023-09-01 DIAGNOSIS — Z6836 Body mass index (BMI) 36.0-36.9, adult: Secondary | ICD-10-CM | POA: Diagnosis not present

## 2023-09-01 DIAGNOSIS — H9190 Unspecified hearing loss, unspecified ear: Secondary | ICD-10-CM | POA: Diagnosis not present

## 2023-09-01 DIAGNOSIS — A084 Viral intestinal infection, unspecified: Secondary | ICD-10-CM | POA: Diagnosis not present

## 2023-09-01 DIAGNOSIS — I482 Chronic atrial fibrillation, unspecified: Secondary | ICD-10-CM | POA: Diagnosis not present

## 2023-09-01 DIAGNOSIS — E860 Dehydration: Secondary | ICD-10-CM | POA: Diagnosis not present

## 2023-09-01 DIAGNOSIS — N184 Chronic kidney disease, stage 4 (severe): Secondary | ICD-10-CM | POA: Diagnosis not present

## 2023-09-01 DIAGNOSIS — R1013 Epigastric pain: Secondary | ICD-10-CM | POA: Diagnosis not present

## 2023-09-01 DIAGNOSIS — N189 Chronic kidney disease, unspecified: Secondary | ICD-10-CM | POA: Diagnosis not present

## 2023-09-01 DIAGNOSIS — Z7901 Long term (current) use of anticoagulants: Secondary | ICD-10-CM | POA: Diagnosis not present

## 2023-09-01 DIAGNOSIS — I129 Hypertensive chronic kidney disease with stage 1 through stage 4 chronic kidney disease, or unspecified chronic kidney disease: Secondary | ICD-10-CM | POA: Diagnosis not present

## 2023-09-01 DIAGNOSIS — R112 Nausea with vomiting, unspecified: Secondary | ICD-10-CM | POA: Diagnosis not present

## 2023-09-04 DIAGNOSIS — Z87891 Personal history of nicotine dependence: Secondary | ICD-10-CM | POA: Diagnosis not present

## 2023-09-04 DIAGNOSIS — E669 Obesity, unspecified: Secondary | ICD-10-CM | POA: Diagnosis not present

## 2023-09-04 DIAGNOSIS — Z90710 Acquired absence of both cervix and uterus: Secondary | ICD-10-CM | POA: Diagnosis not present

## 2023-09-04 DIAGNOSIS — Z6831 Body mass index (BMI) 31.0-31.9, adult: Secondary | ICD-10-CM | POA: Diagnosis not present

## 2023-09-04 DIAGNOSIS — A084 Viral intestinal infection, unspecified: Secondary | ICD-10-CM | POA: Diagnosis not present

## 2023-09-07 DIAGNOSIS — N179 Acute kidney failure, unspecified: Secondary | ICD-10-CM | POA: Diagnosis not present

## 2023-09-07 DIAGNOSIS — D472 Monoclonal gammopathy: Secondary | ICD-10-CM | POA: Diagnosis not present

## 2023-09-07 DIAGNOSIS — N1832 Chronic kidney disease, stage 3b: Secondary | ICD-10-CM | POA: Diagnosis not present

## 2023-09-07 DIAGNOSIS — I10 Essential (primary) hypertension: Secondary | ICD-10-CM | POA: Diagnosis not present

## 2023-09-08 DIAGNOSIS — M47817 Spondylosis without myelopathy or radiculopathy, lumbosacral region: Secondary | ICD-10-CM | POA: Diagnosis not present

## 2023-09-08 DIAGNOSIS — I10 Essential (primary) hypertension: Secondary | ICD-10-CM | POA: Diagnosis not present

## 2023-09-08 DIAGNOSIS — Z952 Presence of prosthetic heart valve: Secondary | ICD-10-CM | POA: Diagnosis not present

## 2023-09-08 DIAGNOSIS — I480 Paroxysmal atrial fibrillation: Secondary | ICD-10-CM | POA: Diagnosis not present

## 2023-09-08 DIAGNOSIS — Z6837 Body mass index (BMI) 37.0-37.9, adult: Secondary | ICD-10-CM | POA: Diagnosis not present

## 2023-09-08 DIAGNOSIS — C541 Malignant neoplasm of endometrium: Secondary | ICD-10-CM | POA: Diagnosis not present

## 2023-09-08 DIAGNOSIS — E782 Mixed hyperlipidemia: Secondary | ICD-10-CM | POA: Diagnosis not present

## 2023-09-08 DIAGNOSIS — Z01810 Encounter for preprocedural cardiovascular examination: Secondary | ICD-10-CM | POA: Diagnosis not present

## 2023-09-08 DIAGNOSIS — E66812 Obesity, class 2: Secondary | ICD-10-CM | POA: Diagnosis not present

## 2023-09-08 DIAGNOSIS — N184 Chronic kidney disease, stage 4 (severe): Secondary | ICD-10-CM | POA: Diagnosis not present

## 2023-09-11 DIAGNOSIS — C541 Malignant neoplasm of endometrium: Secondary | ICD-10-CM | POA: Diagnosis not present

## 2023-09-11 DIAGNOSIS — Z7189 Other specified counseling: Secondary | ICD-10-CM | POA: Diagnosis not present

## 2023-09-11 DIAGNOSIS — N184 Chronic kidney disease, stage 4 (severe): Secondary | ICD-10-CM | POA: Diagnosis not present

## 2023-09-11 DIAGNOSIS — Z952 Presence of prosthetic heart valve: Secondary | ICD-10-CM | POA: Diagnosis not present

## 2023-09-11 DIAGNOSIS — M549 Dorsalgia, unspecified: Secondary | ICD-10-CM | POA: Diagnosis not present

## 2023-09-11 DIAGNOSIS — R296 Repeated falls: Secondary | ICD-10-CM | POA: Diagnosis not present

## 2023-09-11 DIAGNOSIS — I480 Paroxysmal atrial fibrillation: Secondary | ICD-10-CM | POA: Diagnosis not present

## 2023-09-24 DIAGNOSIS — C541 Malignant neoplasm of endometrium: Secondary | ICD-10-CM | POA: Diagnosis not present

## 2023-10-01 DIAGNOSIS — R030 Elevated blood-pressure reading, without diagnosis of hypertension: Secondary | ICD-10-CM | POA: Diagnosis not present

## 2023-10-01 DIAGNOSIS — M47816 Spondylosis without myelopathy or radiculopathy, lumbar region: Secondary | ICD-10-CM | POA: Diagnosis not present

## 2023-10-01 DIAGNOSIS — M5416 Radiculopathy, lumbar region: Secondary | ICD-10-CM | POA: Diagnosis not present

## 2023-10-01 DIAGNOSIS — Z713 Dietary counseling and surveillance: Secondary | ICD-10-CM | POA: Diagnosis not present

## 2023-10-01 DIAGNOSIS — G894 Chronic pain syndrome: Secondary | ICD-10-CM | POA: Diagnosis not present

## 2023-10-01 DIAGNOSIS — Z79899 Other long term (current) drug therapy: Secondary | ICD-10-CM | POA: Diagnosis not present

## 2023-10-16 DIAGNOSIS — N1832 Chronic kidney disease, stage 3b: Secondary | ICD-10-CM | POA: Diagnosis not present

## 2023-10-16 DIAGNOSIS — I480 Paroxysmal atrial fibrillation: Secondary | ICD-10-CM | POA: Diagnosis not present

## 2023-10-16 DIAGNOSIS — M5442 Lumbago with sciatica, left side: Secondary | ICD-10-CM | POA: Diagnosis not present

## 2023-10-16 DIAGNOSIS — Z87891 Personal history of nicotine dependence: Secondary | ICD-10-CM | POA: Diagnosis not present

## 2023-10-16 DIAGNOSIS — I10 Essential (primary) hypertension: Secondary | ICD-10-CM | POA: Diagnosis not present

## 2023-10-16 DIAGNOSIS — M5441 Lumbago with sciatica, right side: Secondary | ICD-10-CM | POA: Diagnosis not present

## 2023-10-16 DIAGNOSIS — Z952 Presence of prosthetic heart valve: Secondary | ICD-10-CM | POA: Diagnosis not present

## 2023-10-16 DIAGNOSIS — E669 Obesity, unspecified: Secondary | ICD-10-CM | POA: Diagnosis not present

## 2023-10-16 DIAGNOSIS — L292 Pruritus vulvae: Secondary | ICD-10-CM | POA: Diagnosis not present

## 2023-10-16 DIAGNOSIS — I129 Hypertensive chronic kidney disease with stage 1 through stage 4 chronic kidney disease, or unspecified chronic kidney disease: Secondary | ICD-10-CM | POA: Diagnosis not present

## 2023-10-16 DIAGNOSIS — G8929 Other chronic pain: Secondary | ICD-10-CM | POA: Diagnosis not present

## 2023-10-16 DIAGNOSIS — Z6831 Body mass index (BMI) 31.0-31.9, adult: Secondary | ICD-10-CM | POA: Diagnosis not present

## 2023-11-02 DIAGNOSIS — Z79899 Other long term (current) drug therapy: Secondary | ICD-10-CM | POA: Diagnosis not present

## 2023-11-02 DIAGNOSIS — C541 Malignant neoplasm of endometrium: Secondary | ICD-10-CM | POA: Diagnosis not present

## 2023-11-02 DIAGNOSIS — I1310 Hypertensive heart and chronic kidney disease without heart failure, with stage 1 through stage 4 chronic kidney disease, or unspecified chronic kidney disease: Secondary | ICD-10-CM | POA: Diagnosis not present

## 2023-11-02 DIAGNOSIS — F17210 Nicotine dependence, cigarettes, uncomplicated: Secondary | ICD-10-CM | POA: Diagnosis not present

## 2023-11-02 DIAGNOSIS — D509 Iron deficiency anemia, unspecified: Secondary | ICD-10-CM | POA: Diagnosis not present

## 2023-11-02 DIAGNOSIS — E785 Hyperlipidemia, unspecified: Secondary | ICD-10-CM | POA: Diagnosis not present

## 2023-11-02 DIAGNOSIS — N184 Chronic kidney disease, stage 4 (severe): Secondary | ICD-10-CM | POA: Diagnosis not present

## 2023-11-02 DIAGNOSIS — Z7902 Long term (current) use of antithrombotics/antiplatelets: Secondary | ICD-10-CM | POA: Diagnosis not present

## 2023-11-02 DIAGNOSIS — M069 Rheumatoid arthritis, unspecified: Secondary | ICD-10-CM | POA: Diagnosis not present

## 2023-11-02 DIAGNOSIS — D631 Anemia in chronic kidney disease: Secondary | ICD-10-CM | POA: Diagnosis not present

## 2023-11-05 DIAGNOSIS — C541 Malignant neoplasm of endometrium: Secondary | ICD-10-CM | POA: Diagnosis not present

## 2023-11-05 DIAGNOSIS — E66812 Obesity, class 2: Secondary | ICD-10-CM | POA: Diagnosis not present

## 2023-11-05 DIAGNOSIS — Z713 Dietary counseling and surveillance: Secondary | ICD-10-CM | POA: Diagnosis not present

## 2023-11-05 DIAGNOSIS — M47817 Spondylosis without myelopathy or radiculopathy, lumbosacral region: Secondary | ICD-10-CM | POA: Diagnosis not present

## 2023-11-05 DIAGNOSIS — R030 Elevated blood-pressure reading, without diagnosis of hypertension: Secondary | ICD-10-CM | POA: Diagnosis not present

## 2023-11-05 DIAGNOSIS — I480 Paroxysmal atrial fibrillation: Secondary | ICD-10-CM | POA: Diagnosis not present

## 2023-11-05 DIAGNOSIS — N184 Chronic kidney disease, stage 4 (severe): Secondary | ICD-10-CM | POA: Diagnosis not present

## 2023-11-05 DIAGNOSIS — Z79899 Other long term (current) drug therapy: Secondary | ICD-10-CM | POA: Diagnosis not present

## 2023-11-05 DIAGNOSIS — M5416 Radiculopathy, lumbar region: Secondary | ICD-10-CM | POA: Diagnosis not present

## 2023-11-05 DIAGNOSIS — G894 Chronic pain syndrome: Secondary | ICD-10-CM | POA: Diagnosis not present

## 2023-11-05 DIAGNOSIS — Z6837 Body mass index (BMI) 37.0-37.9, adult: Secondary | ICD-10-CM | POA: Diagnosis not present

## 2023-11-05 DIAGNOSIS — E782 Mixed hyperlipidemia: Secondary | ICD-10-CM | POA: Diagnosis not present

## 2023-11-05 DIAGNOSIS — I10 Essential (primary) hypertension: Secondary | ICD-10-CM | POA: Diagnosis not present

## 2023-11-05 DIAGNOSIS — Z952 Presence of prosthetic heart valve: Secondary | ICD-10-CM | POA: Diagnosis not present

## 2023-11-11 DIAGNOSIS — E785 Hyperlipidemia, unspecified: Secondary | ICD-10-CM | POA: Diagnosis not present

## 2023-11-11 DIAGNOSIS — D509 Iron deficiency anemia, unspecified: Secondary | ICD-10-CM | POA: Diagnosis not present

## 2023-11-11 DIAGNOSIS — I1310 Hypertensive heart and chronic kidney disease without heart failure, with stage 1 through stage 4 chronic kidney disease, or unspecified chronic kidney disease: Secondary | ICD-10-CM | POA: Diagnosis not present

## 2023-11-11 DIAGNOSIS — C541 Malignant neoplasm of endometrium: Secondary | ICD-10-CM | POA: Diagnosis not present

## 2023-11-11 DIAGNOSIS — N184 Chronic kidney disease, stage 4 (severe): Secondary | ICD-10-CM | POA: Diagnosis not present

## 2023-11-11 DIAGNOSIS — D631 Anemia in chronic kidney disease: Secondary | ICD-10-CM | POA: Diagnosis not present

## 2023-11-20 DIAGNOSIS — D509 Iron deficiency anemia, unspecified: Secondary | ICD-10-CM | POA: Diagnosis not present

## 2023-11-20 DIAGNOSIS — C541 Malignant neoplasm of endometrium: Secondary | ICD-10-CM | POA: Diagnosis not present

## 2023-11-20 DIAGNOSIS — Z510 Encounter for antineoplastic radiation therapy: Secondary | ICD-10-CM | POA: Diagnosis not present

## 2023-12-02 DIAGNOSIS — D509 Iron deficiency anemia, unspecified: Secondary | ICD-10-CM | POA: Diagnosis not present

## 2023-12-02 DIAGNOSIS — Z510 Encounter for antineoplastic radiation therapy: Secondary | ICD-10-CM | POA: Diagnosis not present

## 2023-12-02 DIAGNOSIS — C541 Malignant neoplasm of endometrium: Secondary | ICD-10-CM | POA: Diagnosis not present

## 2023-12-08 DIAGNOSIS — Z510 Encounter for antineoplastic radiation therapy: Secondary | ICD-10-CM | POA: Diagnosis not present

## 2023-12-08 DIAGNOSIS — C541 Malignant neoplasm of endometrium: Secondary | ICD-10-CM | POA: Diagnosis not present

## 2023-12-09 DIAGNOSIS — D509 Iron deficiency anemia, unspecified: Secondary | ICD-10-CM | POA: Diagnosis not present

## 2023-12-09 DIAGNOSIS — C541 Malignant neoplasm of endometrium: Secondary | ICD-10-CM | POA: Diagnosis not present

## 2023-12-09 DIAGNOSIS — Z510 Encounter for antineoplastic radiation therapy: Secondary | ICD-10-CM | POA: Diagnosis not present

## 2023-12-10 DIAGNOSIS — C541 Malignant neoplasm of endometrium: Secondary | ICD-10-CM | POA: Diagnosis not present

## 2023-12-10 DIAGNOSIS — Z510 Encounter for antineoplastic radiation therapy: Secondary | ICD-10-CM | POA: Diagnosis not present

## 2023-12-10 DIAGNOSIS — D509 Iron deficiency anemia, unspecified: Secondary | ICD-10-CM | POA: Diagnosis not present

## 2023-12-11 DIAGNOSIS — Z713 Dietary counseling and surveillance: Secondary | ICD-10-CM | POA: Diagnosis not present

## 2023-12-11 DIAGNOSIS — D509 Iron deficiency anemia, unspecified: Secondary | ICD-10-CM | POA: Diagnosis not present

## 2023-12-11 DIAGNOSIS — M5416 Radiculopathy, lumbar region: Secondary | ICD-10-CM | POA: Diagnosis not present

## 2023-12-11 DIAGNOSIS — M47816 Spondylosis without myelopathy or radiculopathy, lumbar region: Secondary | ICD-10-CM | POA: Diagnosis not present

## 2023-12-11 DIAGNOSIS — Z79899 Other long term (current) drug therapy: Secondary | ICD-10-CM | POA: Diagnosis not present

## 2023-12-11 DIAGNOSIS — C541 Malignant neoplasm of endometrium: Secondary | ICD-10-CM | POA: Diagnosis not present

## 2023-12-11 DIAGNOSIS — G894 Chronic pain syndrome: Secondary | ICD-10-CM | POA: Diagnosis not present

## 2023-12-11 DIAGNOSIS — R030 Elevated blood-pressure reading, without diagnosis of hypertension: Secondary | ICD-10-CM | POA: Diagnosis not present

## 2023-12-11 DIAGNOSIS — Z510 Encounter for antineoplastic radiation therapy: Secondary | ICD-10-CM | POA: Diagnosis not present

## 2023-12-14 DIAGNOSIS — Z510 Encounter for antineoplastic radiation therapy: Secondary | ICD-10-CM | POA: Diagnosis not present

## 2023-12-14 DIAGNOSIS — C541 Malignant neoplasm of endometrium: Secondary | ICD-10-CM | POA: Diagnosis not present

## 2023-12-14 DIAGNOSIS — D509 Iron deficiency anemia, unspecified: Secondary | ICD-10-CM | POA: Diagnosis not present

## 2023-12-15 DIAGNOSIS — C541 Malignant neoplasm of endometrium: Secondary | ICD-10-CM | POA: Diagnosis not present

## 2023-12-15 DIAGNOSIS — Z510 Encounter for antineoplastic radiation therapy: Secondary | ICD-10-CM | POA: Diagnosis not present

## 2023-12-15 DIAGNOSIS — D509 Iron deficiency anemia, unspecified: Secondary | ICD-10-CM | POA: Diagnosis not present

## 2023-12-16 DIAGNOSIS — Z510 Encounter for antineoplastic radiation therapy: Secondary | ICD-10-CM | POA: Diagnosis not present

## 2023-12-16 DIAGNOSIS — C541 Malignant neoplasm of endometrium: Secondary | ICD-10-CM | POA: Diagnosis not present

## 2023-12-16 DIAGNOSIS — D509 Iron deficiency anemia, unspecified: Secondary | ICD-10-CM | POA: Diagnosis not present

## 2023-12-17 DIAGNOSIS — D509 Iron deficiency anemia, unspecified: Secondary | ICD-10-CM | POA: Diagnosis not present

## 2023-12-17 DIAGNOSIS — Z510 Encounter for antineoplastic radiation therapy: Secondary | ICD-10-CM | POA: Diagnosis not present

## 2023-12-17 DIAGNOSIS — C541 Malignant neoplasm of endometrium: Secondary | ICD-10-CM | POA: Diagnosis not present

## 2023-12-18 DIAGNOSIS — Z510 Encounter for antineoplastic radiation therapy: Secondary | ICD-10-CM | POA: Diagnosis not present

## 2023-12-18 DIAGNOSIS — D509 Iron deficiency anemia, unspecified: Secondary | ICD-10-CM | POA: Diagnosis not present

## 2023-12-18 DIAGNOSIS — C541 Malignant neoplasm of endometrium: Secondary | ICD-10-CM | POA: Diagnosis not present

## 2023-12-21 DIAGNOSIS — Z9089 Acquired absence of other organs: Secondary | ICD-10-CM | POA: Diagnosis not present

## 2023-12-21 DIAGNOSIS — M069 Rheumatoid arthritis, unspecified: Secondary | ICD-10-CM | POA: Diagnosis not present

## 2023-12-21 DIAGNOSIS — Z7982 Long term (current) use of aspirin: Secondary | ICD-10-CM | POA: Diagnosis not present

## 2023-12-21 DIAGNOSIS — Z79899 Other long term (current) drug therapy: Secondary | ICD-10-CM | POA: Diagnosis not present

## 2023-12-21 DIAGNOSIS — N184 Chronic kidney disease, stage 4 (severe): Secondary | ICD-10-CM | POA: Diagnosis not present

## 2023-12-21 DIAGNOSIS — Z510 Encounter for antineoplastic radiation therapy: Secondary | ICD-10-CM | POA: Diagnosis not present

## 2023-12-21 DIAGNOSIS — F17210 Nicotine dependence, cigarettes, uncomplicated: Secondary | ICD-10-CM | POA: Diagnosis not present

## 2023-12-21 DIAGNOSIS — E785 Hyperlipidemia, unspecified: Secondary | ICD-10-CM | POA: Diagnosis not present

## 2023-12-21 DIAGNOSIS — C541 Malignant neoplasm of endometrium: Secondary | ICD-10-CM | POA: Diagnosis not present

## 2023-12-21 DIAGNOSIS — I1310 Hypertensive heart and chronic kidney disease without heart failure, with stage 1 through stage 4 chronic kidney disease, or unspecified chronic kidney disease: Secondary | ICD-10-CM | POA: Diagnosis not present

## 2023-12-21 DIAGNOSIS — D509 Iron deficiency anemia, unspecified: Secondary | ICD-10-CM | POA: Diagnosis not present

## 2023-12-22 DIAGNOSIS — Z510 Encounter for antineoplastic radiation therapy: Secondary | ICD-10-CM | POA: Diagnosis not present

## 2023-12-22 DIAGNOSIS — D509 Iron deficiency anemia, unspecified: Secondary | ICD-10-CM | POA: Diagnosis not present

## 2023-12-22 DIAGNOSIS — N184 Chronic kidney disease, stage 4 (severe): Secondary | ICD-10-CM | POA: Diagnosis not present

## 2023-12-22 DIAGNOSIS — I1310 Hypertensive heart and chronic kidney disease without heart failure, with stage 1 through stage 4 chronic kidney disease, or unspecified chronic kidney disease: Secondary | ICD-10-CM | POA: Diagnosis not present

## 2023-12-22 DIAGNOSIS — C541 Malignant neoplasm of endometrium: Secondary | ICD-10-CM | POA: Diagnosis not present

## 2023-12-22 DIAGNOSIS — M069 Rheumatoid arthritis, unspecified: Secondary | ICD-10-CM | POA: Diagnosis not present

## 2023-12-22 DIAGNOSIS — E785 Hyperlipidemia, unspecified: Secondary | ICD-10-CM | POA: Diagnosis not present

## 2023-12-23 DIAGNOSIS — Z510 Encounter for antineoplastic radiation therapy: Secondary | ICD-10-CM | POA: Diagnosis not present

## 2023-12-23 DIAGNOSIS — I1310 Hypertensive heart and chronic kidney disease without heart failure, with stage 1 through stage 4 chronic kidney disease, or unspecified chronic kidney disease: Secondary | ICD-10-CM | POA: Diagnosis not present

## 2023-12-23 DIAGNOSIS — C541 Malignant neoplasm of endometrium: Secondary | ICD-10-CM | POA: Diagnosis not present

## 2023-12-23 DIAGNOSIS — E785 Hyperlipidemia, unspecified: Secondary | ICD-10-CM | POA: Diagnosis not present

## 2023-12-23 DIAGNOSIS — N184 Chronic kidney disease, stage 4 (severe): Secondary | ICD-10-CM | POA: Diagnosis not present

## 2023-12-23 DIAGNOSIS — D509 Iron deficiency anemia, unspecified: Secondary | ICD-10-CM | POA: Diagnosis not present

## 2023-12-23 DIAGNOSIS — M069 Rheumatoid arthritis, unspecified: Secondary | ICD-10-CM | POA: Diagnosis not present

## 2023-12-24 DIAGNOSIS — I1310 Hypertensive heart and chronic kidney disease without heart failure, with stage 1 through stage 4 chronic kidney disease, or unspecified chronic kidney disease: Secondary | ICD-10-CM | POA: Diagnosis not present

## 2023-12-24 DIAGNOSIS — M2041 Other hammer toe(s) (acquired), right foot: Secondary | ICD-10-CM | POA: Diagnosis not present

## 2023-12-24 DIAGNOSIS — C541 Malignant neoplasm of endometrium: Secondary | ICD-10-CM | POA: Diagnosis not present

## 2023-12-24 DIAGNOSIS — N184 Chronic kidney disease, stage 4 (severe): Secondary | ICD-10-CM | POA: Diagnosis not present

## 2023-12-24 DIAGNOSIS — L600 Ingrowing nail: Secondary | ICD-10-CM | POA: Diagnosis not present

## 2023-12-24 DIAGNOSIS — D509 Iron deficiency anemia, unspecified: Secondary | ICD-10-CM | POA: Diagnosis not present

## 2023-12-24 DIAGNOSIS — M069 Rheumatoid arthritis, unspecified: Secondary | ICD-10-CM | POA: Diagnosis not present

## 2023-12-24 DIAGNOSIS — M79674 Pain in right toe(s): Secondary | ICD-10-CM | POA: Diagnosis not present

## 2023-12-24 DIAGNOSIS — E785 Hyperlipidemia, unspecified: Secondary | ICD-10-CM | POA: Diagnosis not present

## 2023-12-24 DIAGNOSIS — L608 Other nail disorders: Secondary | ICD-10-CM | POA: Diagnosis not present

## 2023-12-24 DIAGNOSIS — Z510 Encounter for antineoplastic radiation therapy: Secondary | ICD-10-CM | POA: Diagnosis not present

## 2023-12-24 DIAGNOSIS — L723 Sebaceous cyst: Secondary | ICD-10-CM | POA: Diagnosis not present

## 2023-12-24 DIAGNOSIS — M79675 Pain in left toe(s): Secondary | ICD-10-CM | POA: Diagnosis not present

## 2023-12-24 DIAGNOSIS — L603 Nail dystrophy: Secondary | ICD-10-CM | POA: Diagnosis not present

## 2023-12-24 DIAGNOSIS — L602 Onychogryphosis: Secondary | ICD-10-CM | POA: Diagnosis not present

## 2023-12-24 DIAGNOSIS — D2371 Other benign neoplasm of skin of right lower limb, including hip: Secondary | ICD-10-CM | POA: Diagnosis not present

## 2023-12-25 DIAGNOSIS — N184 Chronic kidney disease, stage 4 (severe): Secondary | ICD-10-CM | POA: Diagnosis not present

## 2023-12-25 DIAGNOSIS — M069 Rheumatoid arthritis, unspecified: Secondary | ICD-10-CM | POA: Diagnosis not present

## 2023-12-25 DIAGNOSIS — E785 Hyperlipidemia, unspecified: Secondary | ICD-10-CM | POA: Diagnosis not present

## 2023-12-25 DIAGNOSIS — Z510 Encounter for antineoplastic radiation therapy: Secondary | ICD-10-CM | POA: Diagnosis not present

## 2023-12-25 DIAGNOSIS — D509 Iron deficiency anemia, unspecified: Secondary | ICD-10-CM | POA: Diagnosis not present

## 2023-12-25 DIAGNOSIS — I1310 Hypertensive heart and chronic kidney disease without heart failure, with stage 1 through stage 4 chronic kidney disease, or unspecified chronic kidney disease: Secondary | ICD-10-CM | POA: Diagnosis not present

## 2023-12-25 DIAGNOSIS — C541 Malignant neoplasm of endometrium: Secondary | ICD-10-CM | POA: Diagnosis not present

## 2023-12-28 DIAGNOSIS — I1310 Hypertensive heart and chronic kidney disease without heart failure, with stage 1 through stage 4 chronic kidney disease, or unspecified chronic kidney disease: Secondary | ICD-10-CM | POA: Diagnosis not present

## 2023-12-28 DIAGNOSIS — N184 Chronic kidney disease, stage 4 (severe): Secondary | ICD-10-CM | POA: Diagnosis not present

## 2023-12-28 DIAGNOSIS — Z510 Encounter for antineoplastic radiation therapy: Secondary | ICD-10-CM | POA: Diagnosis not present

## 2023-12-28 DIAGNOSIS — C541 Malignant neoplasm of endometrium: Secondary | ICD-10-CM | POA: Diagnosis not present

## 2023-12-28 DIAGNOSIS — M069 Rheumatoid arthritis, unspecified: Secondary | ICD-10-CM | POA: Diagnosis not present

## 2023-12-28 DIAGNOSIS — E785 Hyperlipidemia, unspecified: Secondary | ICD-10-CM | POA: Diagnosis not present

## 2023-12-28 DIAGNOSIS — D509 Iron deficiency anemia, unspecified: Secondary | ICD-10-CM | POA: Diagnosis not present

## 2023-12-29 DIAGNOSIS — D509 Iron deficiency anemia, unspecified: Secondary | ICD-10-CM | POA: Diagnosis not present

## 2023-12-29 DIAGNOSIS — C541 Malignant neoplasm of endometrium: Secondary | ICD-10-CM | POA: Diagnosis not present

## 2023-12-29 DIAGNOSIS — E785 Hyperlipidemia, unspecified: Secondary | ICD-10-CM | POA: Diagnosis not present

## 2023-12-29 DIAGNOSIS — Z510 Encounter for antineoplastic radiation therapy: Secondary | ICD-10-CM | POA: Diagnosis not present

## 2023-12-29 DIAGNOSIS — N184 Chronic kidney disease, stage 4 (severe): Secondary | ICD-10-CM | POA: Diagnosis not present

## 2023-12-29 DIAGNOSIS — I1310 Hypertensive heart and chronic kidney disease without heart failure, with stage 1 through stage 4 chronic kidney disease, or unspecified chronic kidney disease: Secondary | ICD-10-CM | POA: Diagnosis not present

## 2023-12-29 DIAGNOSIS — M069 Rheumatoid arthritis, unspecified: Secondary | ICD-10-CM | POA: Diagnosis not present

## 2023-12-30 DIAGNOSIS — M069 Rheumatoid arthritis, unspecified: Secondary | ICD-10-CM | POA: Diagnosis not present

## 2023-12-30 DIAGNOSIS — N184 Chronic kidney disease, stage 4 (severe): Secondary | ICD-10-CM | POA: Diagnosis not present

## 2023-12-30 DIAGNOSIS — Z510 Encounter for antineoplastic radiation therapy: Secondary | ICD-10-CM | POA: Diagnosis not present

## 2023-12-30 DIAGNOSIS — I1310 Hypertensive heart and chronic kidney disease without heart failure, with stage 1 through stage 4 chronic kidney disease, or unspecified chronic kidney disease: Secondary | ICD-10-CM | POA: Diagnosis not present

## 2023-12-30 DIAGNOSIS — E785 Hyperlipidemia, unspecified: Secondary | ICD-10-CM | POA: Diagnosis not present

## 2023-12-30 DIAGNOSIS — C541 Malignant neoplasm of endometrium: Secondary | ICD-10-CM | POA: Diagnosis not present

## 2023-12-30 DIAGNOSIS — D509 Iron deficiency anemia, unspecified: Secondary | ICD-10-CM | POA: Diagnosis not present

## 2023-12-31 DIAGNOSIS — E785 Hyperlipidemia, unspecified: Secondary | ICD-10-CM | POA: Diagnosis not present

## 2023-12-31 DIAGNOSIS — Z510 Encounter for antineoplastic radiation therapy: Secondary | ICD-10-CM | POA: Diagnosis not present

## 2023-12-31 DIAGNOSIS — M069 Rheumatoid arthritis, unspecified: Secondary | ICD-10-CM | POA: Diagnosis not present

## 2023-12-31 DIAGNOSIS — I1310 Hypertensive heart and chronic kidney disease without heart failure, with stage 1 through stage 4 chronic kidney disease, or unspecified chronic kidney disease: Secondary | ICD-10-CM | POA: Diagnosis not present

## 2023-12-31 DIAGNOSIS — D509 Iron deficiency anemia, unspecified: Secondary | ICD-10-CM | POA: Diagnosis not present

## 2023-12-31 DIAGNOSIS — N184 Chronic kidney disease, stage 4 (severe): Secondary | ICD-10-CM | POA: Diagnosis not present

## 2023-12-31 DIAGNOSIS — C541 Malignant neoplasm of endometrium: Secondary | ICD-10-CM | POA: Diagnosis not present

## 2024-01-01 DIAGNOSIS — I1310 Hypertensive heart and chronic kidney disease without heart failure, with stage 1 through stage 4 chronic kidney disease, or unspecified chronic kidney disease: Secondary | ICD-10-CM | POA: Diagnosis not present

## 2024-01-01 DIAGNOSIS — C541 Malignant neoplasm of endometrium: Secondary | ICD-10-CM | POA: Diagnosis not present

## 2024-01-01 DIAGNOSIS — E785 Hyperlipidemia, unspecified: Secondary | ICD-10-CM | POA: Diagnosis not present

## 2024-01-01 DIAGNOSIS — M069 Rheumatoid arthritis, unspecified: Secondary | ICD-10-CM | POA: Diagnosis not present

## 2024-01-01 DIAGNOSIS — D509 Iron deficiency anemia, unspecified: Secondary | ICD-10-CM | POA: Diagnosis not present

## 2024-01-01 DIAGNOSIS — N184 Chronic kidney disease, stage 4 (severe): Secondary | ICD-10-CM | POA: Diagnosis not present

## 2024-01-01 DIAGNOSIS — Z510 Encounter for antineoplastic radiation therapy: Secondary | ICD-10-CM | POA: Diagnosis not present

## 2024-01-04 DIAGNOSIS — C541 Malignant neoplasm of endometrium: Secondary | ICD-10-CM | POA: Diagnosis not present

## 2024-01-04 DIAGNOSIS — E785 Hyperlipidemia, unspecified: Secondary | ICD-10-CM | POA: Diagnosis not present

## 2024-01-04 DIAGNOSIS — N184 Chronic kidney disease, stage 4 (severe): Secondary | ICD-10-CM | POA: Diagnosis not present

## 2024-01-04 DIAGNOSIS — D509 Iron deficiency anemia, unspecified: Secondary | ICD-10-CM | POA: Diagnosis not present

## 2024-01-04 DIAGNOSIS — M069 Rheumatoid arthritis, unspecified: Secondary | ICD-10-CM | POA: Diagnosis not present

## 2024-01-04 DIAGNOSIS — Z510 Encounter for antineoplastic radiation therapy: Secondary | ICD-10-CM | POA: Diagnosis not present

## 2024-01-04 DIAGNOSIS — I1310 Hypertensive heart and chronic kidney disease without heart failure, with stage 1 through stage 4 chronic kidney disease, or unspecified chronic kidney disease: Secondary | ICD-10-CM | POA: Diagnosis not present

## 2024-01-05 DIAGNOSIS — I1310 Hypertensive heart and chronic kidney disease without heart failure, with stage 1 through stage 4 chronic kidney disease, or unspecified chronic kidney disease: Secondary | ICD-10-CM | POA: Diagnosis not present

## 2024-01-05 DIAGNOSIS — E785 Hyperlipidemia, unspecified: Secondary | ICD-10-CM | POA: Diagnosis not present

## 2024-01-05 DIAGNOSIS — N184 Chronic kidney disease, stage 4 (severe): Secondary | ICD-10-CM | POA: Diagnosis not present

## 2024-01-05 DIAGNOSIS — M069 Rheumatoid arthritis, unspecified: Secondary | ICD-10-CM | POA: Diagnosis not present

## 2024-01-05 DIAGNOSIS — C541 Malignant neoplasm of endometrium: Secondary | ICD-10-CM | POA: Diagnosis not present

## 2024-01-05 DIAGNOSIS — D509 Iron deficiency anemia, unspecified: Secondary | ICD-10-CM | POA: Diagnosis not present

## 2024-01-05 DIAGNOSIS — Z510 Encounter for antineoplastic radiation therapy: Secondary | ICD-10-CM | POA: Diagnosis not present

## 2024-01-06 DIAGNOSIS — C541 Malignant neoplasm of endometrium: Secondary | ICD-10-CM | POA: Diagnosis not present

## 2024-01-06 DIAGNOSIS — D509 Iron deficiency anemia, unspecified: Secondary | ICD-10-CM | POA: Diagnosis not present

## 2024-01-06 DIAGNOSIS — N184 Chronic kidney disease, stage 4 (severe): Secondary | ICD-10-CM | POA: Diagnosis not present

## 2024-01-06 DIAGNOSIS — E785 Hyperlipidemia, unspecified: Secondary | ICD-10-CM | POA: Diagnosis not present

## 2024-01-06 DIAGNOSIS — M069 Rheumatoid arthritis, unspecified: Secondary | ICD-10-CM | POA: Diagnosis not present

## 2024-01-06 DIAGNOSIS — I1310 Hypertensive heart and chronic kidney disease without heart failure, with stage 1 through stage 4 chronic kidney disease, or unspecified chronic kidney disease: Secondary | ICD-10-CM | POA: Diagnosis not present

## 2024-01-06 DIAGNOSIS — Z510 Encounter for antineoplastic radiation therapy: Secondary | ICD-10-CM | POA: Diagnosis not present

## 2024-01-07 DIAGNOSIS — C541 Malignant neoplasm of endometrium: Secondary | ICD-10-CM | POA: Diagnosis not present

## 2024-01-07 DIAGNOSIS — Z510 Encounter for antineoplastic radiation therapy: Secondary | ICD-10-CM | POA: Diagnosis not present

## 2024-01-07 DIAGNOSIS — E785 Hyperlipidemia, unspecified: Secondary | ICD-10-CM | POA: Diagnosis not present

## 2024-01-07 DIAGNOSIS — D509 Iron deficiency anemia, unspecified: Secondary | ICD-10-CM | POA: Diagnosis not present

## 2024-01-07 DIAGNOSIS — I1310 Hypertensive heart and chronic kidney disease without heart failure, with stage 1 through stage 4 chronic kidney disease, or unspecified chronic kidney disease: Secondary | ICD-10-CM | POA: Diagnosis not present

## 2024-01-07 DIAGNOSIS — M069 Rheumatoid arthritis, unspecified: Secondary | ICD-10-CM | POA: Diagnosis not present

## 2024-01-07 DIAGNOSIS — N184 Chronic kidney disease, stage 4 (severe): Secondary | ICD-10-CM | POA: Diagnosis not present

## 2024-01-08 DIAGNOSIS — I1310 Hypertensive heart and chronic kidney disease without heart failure, with stage 1 through stage 4 chronic kidney disease, or unspecified chronic kidney disease: Secondary | ICD-10-CM | POA: Diagnosis not present

## 2024-01-08 DIAGNOSIS — E785 Hyperlipidemia, unspecified: Secondary | ICD-10-CM | POA: Diagnosis not present

## 2024-01-08 DIAGNOSIS — D509 Iron deficiency anemia, unspecified: Secondary | ICD-10-CM | POA: Diagnosis not present

## 2024-01-08 DIAGNOSIS — C541 Malignant neoplasm of endometrium: Secondary | ICD-10-CM | POA: Diagnosis not present

## 2024-01-08 DIAGNOSIS — N184 Chronic kidney disease, stage 4 (severe): Secondary | ICD-10-CM | POA: Diagnosis not present

## 2024-01-08 DIAGNOSIS — M069 Rheumatoid arthritis, unspecified: Secondary | ICD-10-CM | POA: Diagnosis not present

## 2024-01-08 DIAGNOSIS — Z510 Encounter for antineoplastic radiation therapy: Secondary | ICD-10-CM | POA: Diagnosis not present

## 2024-01-11 DIAGNOSIS — C541 Malignant neoplasm of endometrium: Secondary | ICD-10-CM | POA: Diagnosis not present

## 2024-01-11 DIAGNOSIS — N179 Acute kidney failure, unspecified: Secondary | ICD-10-CM | POA: Diagnosis not present

## 2024-01-11 DIAGNOSIS — N1832 Chronic kidney disease, stage 3b: Secondary | ICD-10-CM | POA: Diagnosis not present

## 2024-01-11 DIAGNOSIS — I10 Essential (primary) hypertension: Secondary | ICD-10-CM | POA: Diagnosis not present

## 2024-01-11 DIAGNOSIS — Z510 Encounter for antineoplastic radiation therapy: Secondary | ICD-10-CM | POA: Diagnosis not present

## 2024-01-11 DIAGNOSIS — D509 Iron deficiency anemia, unspecified: Secondary | ICD-10-CM | POA: Diagnosis not present

## 2024-01-11 DIAGNOSIS — N184 Chronic kidney disease, stage 4 (severe): Secondary | ICD-10-CM | POA: Diagnosis not present

## 2024-01-11 DIAGNOSIS — E785 Hyperlipidemia, unspecified: Secondary | ICD-10-CM | POA: Diagnosis not present

## 2024-01-11 DIAGNOSIS — M069 Rheumatoid arthritis, unspecified: Secondary | ICD-10-CM | POA: Diagnosis not present

## 2024-01-11 DIAGNOSIS — I1310 Hypertensive heart and chronic kidney disease without heart failure, with stage 1 through stage 4 chronic kidney disease, or unspecified chronic kidney disease: Secondary | ICD-10-CM | POA: Diagnosis not present

## 2024-01-11 DIAGNOSIS — D472 Monoclonal gammopathy: Secondary | ICD-10-CM | POA: Diagnosis not present

## 2024-01-12 DIAGNOSIS — Z510 Encounter for antineoplastic radiation therapy: Secondary | ICD-10-CM | POA: Diagnosis not present

## 2024-01-12 DIAGNOSIS — D509 Iron deficiency anemia, unspecified: Secondary | ICD-10-CM | POA: Diagnosis not present

## 2024-01-12 DIAGNOSIS — C541 Malignant neoplasm of endometrium: Secondary | ICD-10-CM | POA: Diagnosis not present

## 2024-01-12 DIAGNOSIS — I1310 Hypertensive heart and chronic kidney disease without heart failure, with stage 1 through stage 4 chronic kidney disease, or unspecified chronic kidney disease: Secondary | ICD-10-CM | POA: Diagnosis not present

## 2024-01-12 DIAGNOSIS — M069 Rheumatoid arthritis, unspecified: Secondary | ICD-10-CM | POA: Diagnosis not present

## 2024-01-12 DIAGNOSIS — N184 Chronic kidney disease, stage 4 (severe): Secondary | ICD-10-CM | POA: Diagnosis not present

## 2024-01-12 DIAGNOSIS — E785 Hyperlipidemia, unspecified: Secondary | ICD-10-CM | POA: Diagnosis not present

## 2024-01-13 DIAGNOSIS — M5441 Lumbago with sciatica, right side: Secondary | ICD-10-CM | POA: Diagnosis not present

## 2024-01-13 DIAGNOSIS — N1832 Chronic kidney disease, stage 3b: Secondary | ICD-10-CM | POA: Diagnosis not present

## 2024-01-13 DIAGNOSIS — Z87891 Personal history of nicotine dependence: Secondary | ICD-10-CM | POA: Diagnosis not present

## 2024-01-13 DIAGNOSIS — E785 Hyperlipidemia, unspecified: Secondary | ICD-10-CM | POA: Diagnosis not present

## 2024-01-13 DIAGNOSIS — I480 Paroxysmal atrial fibrillation: Secondary | ICD-10-CM | POA: Diagnosis not present

## 2024-01-13 DIAGNOSIS — I129 Hypertensive chronic kidney disease with stage 1 through stage 4 chronic kidney disease, or unspecified chronic kidney disease: Secondary | ICD-10-CM | POA: Diagnosis not present

## 2024-01-13 DIAGNOSIS — M5442 Lumbago with sciatica, left side: Secondary | ICD-10-CM | POA: Diagnosis not present

## 2024-01-13 DIAGNOSIS — Z0001 Encounter for general adult medical examination with abnormal findings: Secondary | ICD-10-CM | POA: Diagnosis not present

## 2024-01-13 DIAGNOSIS — C55 Malignant neoplasm of uterus, part unspecified: Secondary | ICD-10-CM | POA: Diagnosis not present

## 2024-01-13 DIAGNOSIS — G8929 Other chronic pain: Secondary | ICD-10-CM | POA: Diagnosis not present

## 2024-01-13 DIAGNOSIS — Z6830 Body mass index (BMI) 30.0-30.9, adult: Secondary | ICD-10-CM | POA: Diagnosis not present

## 2024-01-13 DIAGNOSIS — R82998 Other abnormal findings in urine: Secondary | ICD-10-CM | POA: Diagnosis not present
